# Patient Record
Sex: Female | Born: 1970 | Race: White | Hispanic: No | Marital: Married | Smoking: Former smoker
Health system: Southern US, Community
[De-identification: ages and names within clinical notes are randomized; demographics above are authoritative.]

---

## 2014-11-21 LAB — HM PAP SMEAR: HM PAP: NEGATIVE

## 2016-11-07 ENCOUNTER — Emergency Department (INDEPENDENT_AMBULATORY_CARE_PROVIDER_SITE_OTHER)
Admission: EM | Admit: 2016-11-07 | Discharge: 2016-11-07 | Disposition: A | Discharge status: Home / Self Care | Payer: Managed Care, Other (non HMO) | Source: Home / Self Care | Attending: Family Medicine | Admitting: Family Medicine

## 2016-11-07 ENCOUNTER — Encounter: Payer: Self-pay | Admitting: Emergency Medicine

## 2016-11-07 ENCOUNTER — Emergency Department (INDEPENDENT_AMBULATORY_CARE_PROVIDER_SITE_OTHER): Payer: Managed Care, Other (non HMO)

## 2016-11-07 DIAGNOSIS — R05 Cough: Secondary | ICD-10-CM | POA: Diagnosis not present

## 2016-11-07 DIAGNOSIS — R053 Chronic cough: Secondary | ICD-10-CM

## 2016-11-07 DIAGNOSIS — R0989 Other specified symptoms and signs involving the circulatory and respiratory systems: Secondary | ICD-10-CM

## 2016-11-07 MED ORDER — PREDNISONE 20 MG PO TABS: ORAL_TABLET | ORAL | 0 refills | Status: DC

## 2016-11-07 NOTE — ED Triage Notes (Signed)
Pt has had a wet cough since November, had a bad cold in November, some chest tightness, no ear pain.

## 2016-11-07 NOTE — Discharge Instructions (Signed)
May take Delsym Cough Suppressant at bedtime for nighttime cough.  

## 2016-11-07 NOTE — ED Provider Notes (Signed)
[facility]    CSN: [id number] Arrival date & time: 11/07/16  1357     History   Chief Complaint Chief Complaint  Patient presents with  . Cough    HPI Tracy Burnett is a 46 y.o. female.   Patient reports that she had a cold two months ago and seemed to recover without difficulty.  However she has had a persistent productive cough without pleuritic pain or shortness of breath.  She has had no fevers, chills, and sweats.  She feels well otherwise.   The history is provided by the patient.    History reviewed. No pertinent past medical history.  There are no active problems to display for this patient.   History reviewed. No pertinent surgical history.  OB History    No data available       Home Medications    Prior to Admission medications   Medication Sig Start Date End Date Taking? Authorizing Provider  norethindrone-ethinyl estradiol-iron (ESTROSTEP FE,TILIA FE,TRI-LEGEST FE) 1-20/1-30/1-35 MG-MCG tablet Take 1 tablet by mouth daily.   Yes Historical Provider, MD  predniSONE (DELTASONE) 20 MG tablet Take one tab by mouth twice daily for 5 days, then one daily. Take with food. 11/07/16   Lattie HawStephen A Neesa Knapik, MD    Family History No family history on file.  Social History Social History  Substance Use Topics  . Smoking status: Former Games developermoker  . Smokeless tobacco: Never Used  . Alcohol use Yes     Comment: 3 drinks weekly     Allergies   Patient has no allergy information on record.   Review of Systems Review of Systems No sore throat + cough No pleuritic pain No wheezing No nasal congestion No post-nasal drainage No sinus pain/pressure No itchy/red eyes No earache No hemoptysis No SOB No fever/chills No nausea No vomiting No abdominal pain No diarrhea No urinary symptoms No skin rash No fatigue No myalgias No headache    Physical Exam Triage Vital Signs ED Triage Vitals  Enc Vitals Group     BP 11/07/16 1428 101/72     Pulse Rate 11/07/16 1428 83     Resp --      Temp 11/07/16 1428 98.2 F (36.8 C)     Temp Source 11/07/16 1428 Oral     SpO2 11/07/16 1428 99 %     Weight 11/07/16 1428 132 lb 8 oz (60.1 kg)     Height 11/07/16 1428 5\' 2"  (1.575 m)     Head Circumference --      Peak Flow --      Pain Score 11/07/16 1432 0     Pain Loc --      Pain Edu? --      Excl. in GC? --    No data found.   Updated Vital Signs BP 101/72 (BP Location: Left Arm)   Pulse 83   Temp 98.2 F (36.8 C) (Oral)   Ht 5\' 2"  (1.575 m)   Wt 132 lb 8 oz (60.1 kg)   LMP 10/31/2016 (Exact Date)   SpO2 99%   BMI 24.23 kg/m   Visual Acuity Right Eye Distance:   Left Eye Distance:   Bilateral Distance:    Right Eye Near:   Left Eye Near:    Bilateral Near:     Physical Exam Nursing notes and Vital Signs reviewed. Appearance:  Patient appears stated age, and in no acute distress Eyes:  Pupils are equal, round, and reactive to light and  accomodation.  Extraocular movement is intact.  Conjunctivae are not inflamed  Ears:  Canals normal.  Tympanic membranes normal.  Nose:   Normal turbinates.  No sinus tenderness.     Pharynx:  Normal Neck:  Supple.  No adenopathy. Lungs:  Clear to auscultation.  Breath sounds are equal.  Moving air well. Heart:  Regular rate and rhythm without murmurs, rubs, or gallops.  Abdomen:  Nontender without masses or hepatosplenomegaly.  Bowel sounds are present.  No CVA or flank tenderness.  Extremities:  No edema.  Skin:  No rash present.    UC Treatments / Results  Labs (all labs ordered are listed, but only abnormal results are displayed) Labs Reviewed - No data to display  EKG  EKG Interpretation None       Radiology Dg Chest 2 View  Result Date: 11/07/2016 CLINICAL DATA:  Persistent cough, chest tightness EXAM: CHEST  2 VIEW COMPARISON:  None. FINDINGS: Cardiomediastinal silhouette is unremarkable. No infiltrate or pleural effusion. No pulmonary edema. Mild upper  thoracic levoscoliosis. Minimal degenerative changes mid thoracic spine. IMPRESSION: No active cardiopulmonary disease. Electronically Signed   By: Natasha Mead M.D.   On: 11/07/2016 15:06    Procedures Procedures (including critical care time)  Medications Ordered in UC Medications - No data to display   Initial Impression / Assessment and Plan / UC Course  I have reviewed the triage vital signs and the nursing notes.  Pertinent labs & imaging results that were available during my care of the patient were reviewed by me and considered in my medical decision making (see chart for details).    There is no evidence of bacterial infection today.   Suspect post-infectious cough. Begin prednisone burst/taper. May take Delsym Cough Suppressant at bedtime for nighttime cough.  Followup with Family Doctor if not improved in one week.     Final Clinical Impressions(s) / UC Diagnoses   Final diagnoses:  Persistent cough for 3 weeks or longer    New Prescriptions New Prescriptions   PREDNISONE (DELTASONE) 20 MG TABLET    Take one tab by mouth twice daily for 5 days, then one daily. Take with food.     Lattie Haw, MD 11/14/16 [phone]

## 2017-01-28 ENCOUNTER — Ambulatory Visit (INDEPENDENT_AMBULATORY_CARE_PROVIDER_SITE_OTHER): Payer: Managed Care, Other (non HMO) | Admitting: Osteopathic Medicine

## 2017-01-28 ENCOUNTER — Encounter: Payer: Self-pay | Admitting: Osteopathic Medicine

## 2017-01-28 VITALS — BP 98/52 | HR 75 | Ht 62.0 in | Wt 129.0 lb

## 2017-01-28 DIAGNOSIS — Z862 Personal history of diseases of the blood and blood-forming organs and certain disorders involving the immune mechanism: Secondary | ICD-10-CM | POA: Diagnosis not present

## 2017-01-28 DIAGNOSIS — Z8669 Personal history of other diseases of the nervous system and sense organs: Secondary | ICD-10-CM | POA: Diagnosis not present

## 2017-01-28 DIAGNOSIS — Z0000 Encounter for general adult medical examination without abnormal findings: Secondary | ICD-10-CM | POA: Diagnosis not present

## 2017-01-28 DIAGNOSIS — F331 Major depressive disorder, recurrent, moderate: Secondary | ICD-10-CM | POA: Diagnosis not present

## 2017-01-28 MED ORDER — SUMATRIPTAN SUCCINATE 100 MG PO TABS: 100.0000 mg | ORAL_TABLET | ORAL | 2 refills | Status: DC | PRN

## 2017-01-28 MED ORDER — FLUOXETINE HCL 10 MG PO TABS: 10.0000 mg | ORAL_TABLET | Freq: Every day | ORAL | 1 refills | Status: DC

## 2017-01-28 NOTE — Patient Instructions (Signed)
Plan: 1. Will start Prozac, plan to followup in 4-6 weeks or sooner if needed for depression further discussion 2. Fasting labs at your convenience

## 2017-01-28 NOTE — Progress Notes (Signed)
HPI: Tracy Burnett is a 46 y.o. female  who presents to [facility] today, 01/28/17,  for chief complaint of:  Chief Complaint  Patient presents with  . Annual Exam  . Depression    Patient requests annual physical exam for discount from employer. Has other specific problem-based complaints as well. See below for review of preventive care. Other medical issues addressed as follows:  Depression: Patient reports increased stress at home, she has a stressful job that well. Has 2 daughters, both of whom have ADHD and the older has behavioral problems in addition to that. She feels overwhelmed managing the household as well as dealing with work. No history of other depressive problems in the past, is not interested in seeking counseling at this point due to unable to get off work without taking unpaid leave.  Sleep issues: Has been taking Benadryl on a nightly basis which is helping but she is nervous to be taking this medication long-term. Previously taking Tylenol PM but was nervous to be taking acetaminophen too frequently.      Past medical, surgical, social and family history reviewed: There are no active problems to display for this patient.  No past surgical history on file. Social History  Substance Use Topics  . Smoking status: Former Games developer  . Smokeless tobacco: Never Used  . Alcohol use Yes     Comment: 3 drinks weekly   No family history on file.   Current medication list and allergy/intolerance information reviewed:   Current Outpatient Prescriptions  Medication Sig Dispense Refill  . norethindrone-ethinyl estradiol-iron (ESTROSTEP FE,TILIA FE,TRI-LEGEST FE) 1-20/1-30/1-35 MG-MCG tablet Take 1 tablet by mouth daily.     No current facility-administered medications for this visit.    Not on File    Review of Systems:  Constitutional:  No  fever, no chills, No recent illness, No unintentional weight changes. +significant fatigue.    HEENT: No  headache, no vision change, no hearing change, No sore throat, No  sinus pressure  Cardiac: No  chest pain, No  pressure, No palpitations, No  Orthopnea  Respiratory:  No  shortness of breath. No  Cough  Gastrointestinal: No  abdominal pain, No  nausea, No  vomiting,  No  blood in stool, No  diarrhea, No  constipation   Musculoskeletal: No new myalgia/arthralgia  Genitourinary: No  incontinence, No  abnormal genital bleeding, No abnormal genital discharge  Skin: No  Rash, No other wounds/concerning lesions  Endocrine: No cold intolerance,  No heat intolerance. No polyuria/polydipsia/polyphagia   Neurologic: No  weakness, No  dizziness, No  slurred speech/focal weakness/facial droop, +memory problems   Psychiatric: +concerns with depression, No  concerns with anxiety, +sleep problems, No mood problems  Exam:  BP (!) 98/52   Pulse 75   Ht  (1.575 m)   Wt 129 lb (58.5 kg)   BMI 23.59 kg/m   Constitutional: VS see above. General Appearance: alert, well-developed, well-nourished, NAD  Eyes: Normal lids and conjunctive, non-icteric sclera  Ears, Nose, Mouth, Throat: MMM, Normal external inspection ears/nares/mouth/lips/gums. TM normal bilaterally. Pharynx/tonsils no erythema, no exudate. Nasal mucosa normal.   Neck: No masses, trachea midline. No thyroid enlargement. No tenderness/mass appreciated. No lymphadenopathy  Respiratory: Normal respiratory effort. no wheeze, no rhonchi, no rales  Cardiovascular: S1/S2 normal, no murmur, no rub/gallop auscultated. RRR. No lower extremity edema.   Gastrointestinal: Nontender, no masses. No hepatomegaly, no splenomegaly. No hernia appreciated. Bowel sounds normal. Rectal exam deferred.   Musculoskeletal:  Gait normal. No clubbing/cyanosis of digits.   Neurological: Normal balance/coordination. No tremor. No cranial nerve deficit on limited exam. Motor and sensation intact and symmetric. Cerebellar reflexes intact.    Skin: warm, dry, intact. No rash/ulcer. No concerning nevi or subq nodules on limited exam.    Psychiatric: Normal judgment/insight. Normal mood and affect. Oriented x3. (+)passive death wish but no thoughts of hurting self/others    Depression screen [facility] 2/9 01/28/2017  Decreased Interest 2  Down, Depressed, Hopeless 3  PHQ - 2 Score 5  Altered sleeping 3  Tired, decreased energy 3  Change in appetite 2  Feeling bad or failure about yourself  3  Trouble concentrating 1  Moving slowly or fidgety/restless 0  Suicidal thoughts 2  PHQ-9 Score 19     ASSESSMENT/PLAN:   Preventive care: See below. Patient is up-to-date except for routine screening labs. We'll also get iron panel given history of anemia.  Problem-based: Requests treatment for depression. Will trial low-dose fluoxetine with plans to titrate up if tolerating medication but not feeling full effect. Again, patient declines counseling at this time. Advised follow-up in 4-6 weeks for recheck. We'll try to work with the patient, understandable that it is difficult to get time off work but unless her symptoms totally resolve on the medication would definitely want to follow-up with her.  Annual physical exam - Plan: CBC with Differential/Platelet, Lipid panel, TSH, COMPLETE METABOLIC PANEL WITH GFR, VITAMIN D 25 Hydroxy (Vit-D Deficiency, Fractures), Iron, TIBC and Ferritin Panel  Moderate episode of recurrent major depressive disorder (HCC) - Plan: FLUoxetine (PROZAC) 10 MG tablet  History of anemia - Plan: CBC with Differential/Platelet, Iron, TIBC and Ferritin Panel  History of migraine headaches - Plan: SUMAtriptan (IMITREX) 100 MG tablet   FEMALE PREVENTIVE CARE Updated 01/28/17   ANNUAL SCREENING/COUNSELING  Diet/Exercise - HEALTHY HABITS DISCUSSED TO DECREASE CV RISK History  Smoking Status  . Former Smoker  Smokeless Tobacco  . Never Used   History  Alcohol Use  . Yes    Comment: 3 drinks weekly    Depression screen [facility] 2/9 01/28/2017  Decreased Interest 2  Down, Depressed, Hopeless 3  PHQ - 2 Score 5  Altered sleeping 3  Tired, decreased energy 3  Change in appetite 2  Feeling bad or failure about yourself  3  Trouble concentrating 1  Moving slowly or fidgety/restless 0  Suicidal thoughts 2  PHQ-9 Score 19    Domestic violence concerns - no  HTN SCREENING - SEE VITALS  SEXUAL HEALTH  Sexually active in the past year - Yes with female.  Need/want STI testing today? - no  Concerns about libido or pain with sex? - no  Plans for pregnancy? - on OCP   INFECTIOUS DISEASE SCREENING  HIV - does not need  GC/CT - does not need  HepC - DOB [birth year] - does not need  TB - does not need  DISEASE SCREENING  Lipid - needs  DM2 - needs  Osteoporosis - women age 60+ - does not need  CANCER SCREENING  Cervical - does not need  Breast - does not need  Lung - does not need  Colon - does not need  ADULT VACCINATION  Influenza - annual vaccine recommended  Td - booster every 10 years   Zoster - option at 50, yes at 60+     There is no immunization history on file for this patient.     Patient Instructions  Plan: 1. Will start  Prozac, plan to followup in 4-6 weeks or sooner if needed for depression further discussion 2. Fasting labs at your convenience       Visit summary with medication list and pertinent instructions was printed for patient to review. All questions at time of visit were answered - patient instructed to contact office with any additional concerns. ER/RTC precautions were reviewed with the patient. Follow-up plan: Return in about 1 year (around 01/28/2018) for ANNUAL PHYSICAL, soonre to address any other issues and as directed for depression follow-up .    There are other unrelated non-urgent complaints (memory concerns, insomnia, musculoskeletal complaints), but due to the busy schedule and the amount of time I've already spent  with her, time does not permit me to address these routine issues at today's visit. I've requested another appointment to review these additional issues.

## 2017-03-05 LAB — COMPLETE METABOLIC PANEL WITH GFR
ALBUMIN: 4 g/dL (ref 3.6–5.1)
ALK PHOS: 33 U/L (ref 33–115)
ALT: 16 U/L (ref 6–29)
AST: 15 U/L (ref 10–35)
BUN: 16 mg/dL (ref 7–25)
CALCIUM: 8.7 mg/dL (ref 8.6–10.2)
CO2: 25 mmol/L (ref 20–31)
CREATININE: 0.93 mg/dL (ref 0.50–1.10)
Chloride: 107 mmol/L (ref 98–110)
GFR, EST AFRICAN AMERICAN: 85 mL/min (ref >=60)
GFR, Est Non African American: 74 mL/min (ref >=60)
Glucose, Bld: 89 mg/dL (ref 65–99)
POTASSIUM: 4.5 mmol/L (ref 3.5–5.3)
Sodium: 140 mmol/L (ref 135–146)
TOTAL PROTEIN: 6.1 g/dL (ref 6.1–8.1)
Total Bilirubin: 0.5 mg/dL (ref 0.2–1.2)

## 2017-03-05 LAB — CBC WITH DIFFERENTIAL/PLATELET
BASOS ABS: 63 {cells}/uL (ref 0–200)
Basophils Relative: 1 %
EOS ABS: 189 {cells}/uL (ref 15–500)
EOS PCT: 3 %
HCT: 44.5 % (ref 35.0–45.0)
HEMOGLOBIN: 14.7 g/dL (ref 11.7–15.5)
Lymphocytes Relative: 34 %
Lymphs Abs: 2142 cells/uL (ref 850–3900)
MCH: 29.8 pg (ref 27.0–33.0)
MCHC: 33 g/dL (ref 32.0–36.0)
MCV: 90.3 fL (ref 80.0–100.0)
MONOS PCT: 5 %
MPV: 11.1 fL (ref 7.5–12.5)
Monocytes Absolute: 315 cells/uL (ref 200–950)
NEUTROS ABS: 3591 {cells}/uL (ref 1500–7800)
NEUTROS PCT: 57 %
PLATELETS: 207 10*3/uL (ref 140–400)
RBC: 4.93 MIL/uL (ref 3.80–5.10)
RDW: 13.5 % (ref 11.0–15.0)
WBC: 6.3 10*3/uL (ref 3.8–10.8)

## 2017-03-05 LAB — LIPID PANEL
CHOLESTEROL: 171 mg/dL (ref <200)
HDL: 53 mg/dL (ref >50)
LDL Cholesterol: 110 mg/dL — ABNORMAL HIGH (ref <100)
Total CHOL/HDL Ratio: 3.2 Ratio (ref <5.0)
Triglycerides: 38 mg/dL (ref <150)
VLDL: 8 mg/dL (ref <30)

## 2017-03-06 LAB — IRON,TIBC AND FERRITIN PANEL
%SAT: 14 % (ref 11–50)
FERRITIN: 53 ng/mL (ref 10–232)
Iron: 43 ug/dL (ref 40–190)
TIBC: 302 ug/dL (ref 250–450)

## 2017-03-06 LAB — VITAMIN D 25 HYDROXY (VIT D DEFICIENCY, FRACTURES): VIT D 25 HYDROXY: 40 ng/mL (ref 30–100)

## 2017-03-06 LAB — TSH: TSH: 3.44 mIU/L

## 2017-03-16 ENCOUNTER — Other Ambulatory Visit: Payer: Self-pay | Admitting: Osteopathic Medicine

## 2017-03-16 DIAGNOSIS — F331 Major depressive disorder, recurrent, moderate: Secondary | ICD-10-CM

## 2017-04-07 ENCOUNTER — Other Ambulatory Visit: Payer: Self-pay | Admitting: Osteopathic Medicine

## 2017-04-07 DIAGNOSIS — F331 Major depressive disorder, recurrent, moderate: Secondary | ICD-10-CM

## 2017-04-15 ENCOUNTER — Other Ambulatory Visit: Payer: Self-pay | Admitting: Osteopathic Medicine

## 2017-04-15 DIAGNOSIS — F331 Major depressive disorder, recurrent, moderate: Secondary | ICD-10-CM

## 2017-05-14 ENCOUNTER — Other Ambulatory Visit: Payer: Self-pay | Admitting: Osteopathic Medicine

## 2017-05-14 DIAGNOSIS — F331 Major depressive disorder, recurrent, moderate: Secondary | ICD-10-CM

## 2017-05-31 ENCOUNTER — Other Ambulatory Visit: Payer: Self-pay | Admitting: Osteopathic Medicine

## 2017-05-31 DIAGNOSIS — F331 Major depressive disorder, recurrent, moderate: Secondary | ICD-10-CM

## 2017-06-18 ENCOUNTER — Other Ambulatory Visit: Payer: Self-pay | Admitting: Osteopathic Medicine

## 2017-06-19 ENCOUNTER — Other Ambulatory Visit: Payer: Self-pay | Admitting: Osteopathic Medicine

## 2017-06-19 DIAGNOSIS — F331 Major depressive disorder, recurrent, moderate: Secondary | ICD-10-CM

## 2017-06-22 ENCOUNTER — Telehealth: Payer: Self-pay | Admitting: Osteopathic Medicine

## 2017-06-22 NOTE — Telephone Encounter (Signed)
I called pt on her cell and left a message for her to schedule a f/u appt with Dr.Alexander

## 2017-06-23 ENCOUNTER — Other Ambulatory Visit: Payer: Self-pay | Admitting: Osteopathic Medicine

## 2017-06-23 DIAGNOSIS — F331 Major depressive disorder, recurrent, moderate: Secondary | ICD-10-CM

## 2017-06-30 ENCOUNTER — Other Ambulatory Visit: Payer: Self-pay | Admitting: Osteopathic Medicine

## 2017-06-30 DIAGNOSIS — F331 Major depressive disorder, recurrent, moderate: Secondary | ICD-10-CM

## 2017-07-21 ENCOUNTER — Other Ambulatory Visit: Payer: Self-pay | Admitting: Osteopathic Medicine

## 2017-07-23 ENCOUNTER — Other Ambulatory Visit: Payer: Self-pay | Admitting: Osteopathic Medicine

## 2017-08-02 ENCOUNTER — Other Ambulatory Visit: Payer: Self-pay | Admitting: Osteopathic Medicine

## 2017-08-30 ENCOUNTER — Other Ambulatory Visit: Payer: Self-pay | Admitting: Osteopathic Medicine

## 2017-09-01 ENCOUNTER — Telehealth: Payer: Self-pay

## 2017-09-01 MED ORDER — FLUOXETINE HCL 10 MG PO TABS: ORAL_TABLET | ORAL | 3 refills | Status: DC

## 2017-09-01 NOTE — Telephone Encounter (Signed)
Fine to send #90 w/ 3 refills to whatever pharmacy she'd like

## 2017-09-01 NOTE — Telephone Encounter (Signed)
Patient called and requested a refill on Prozac, advised patient that she needed a follow up appointment and she stated that she does not have insurance,please advise. Torence Palmeri,CMA

## 2017-09-02 NOTE — Telephone Encounter (Signed)
Done. Endia Moncur,CMA  

## 2017-10-02 ENCOUNTER — Other Ambulatory Visit: Payer: Self-pay | Admitting: Osteopathic Medicine

## 2017-10-02 DIAGNOSIS — F331 Major depressive disorder, recurrent, moderate: Secondary | ICD-10-CM

## 2017-10-23 ENCOUNTER — Other Ambulatory Visit: Payer: Self-pay | Admitting: Osteopathic Medicine

## 2017-10-23 DIAGNOSIS — F331 Major depressive disorder, recurrent, moderate: Secondary | ICD-10-CM

## 2017-11-06 ENCOUNTER — Other Ambulatory Visit: Payer: Self-pay | Admitting: Osteopathic Medicine

## 2017-11-06 DIAGNOSIS — Z8669 Personal history of other diseases of the nervous system and sense organs: Secondary | ICD-10-CM

## 2017-11-08 NOTE — Telephone Encounter (Signed)
Pharmacy requesting refill. Pls advise if appropriate. Thanks. 

## 2017-12-28 ENCOUNTER — Telehealth: Payer: Self-pay

## 2017-12-28 DIAGNOSIS — F329 Major depressive disorder, single episode, unspecified: Secondary | ICD-10-CM

## 2017-12-28 DIAGNOSIS — F419 Anxiety disorder, unspecified: Principal | ICD-10-CM

## 2017-12-28 NOTE — Telephone Encounter (Signed)
Called pt, no answer. Left a brief vm msg for pt to return a call back regarding provider's note.

## 2017-12-28 NOTE — Telephone Encounter (Signed)
Pt called requesting a referral for Mental health for depression/medication maintenance. As per pt, still having episodes & wants to do therapy. Pt can be contacted by provider at [phone] for further inquiries.

## 2017-12-28 NOTE — Telephone Encounter (Signed)
This person has had one visit with me almost a year ago. I'm happy to place a referral if she would like, she can also come here to talk to me if needed. We can always adjust medications from our end first especially if it takes a while to get into see a psychiatrist and counselor.   Please follow up with her regarding the above it and if there is anywhere in particular she would like the referral sent or if she needs appointment with me. Will go ahead and place referral downstairs

## 2018-01-05 NOTE — Telephone Encounter (Signed)
[facility] today & [facility]

## 2018-01-09 ENCOUNTER — Other Ambulatory Visit: Payer: Self-pay | Admitting: Osteopathic Medicine

## 2018-01-09 DIAGNOSIS — Z8669 Personal history of other diseases of the nervous system and sense organs: Secondary | ICD-10-CM

## 2018-03-19 ENCOUNTER — Other Ambulatory Visit: Payer: Self-pay | Admitting: Osteopathic Medicine

## 2018-03-19 DIAGNOSIS — Z8669 Personal history of other diseases of the nervous system and sense organs: Secondary | ICD-10-CM

## 2018-06-29 ENCOUNTER — Encounter: Payer: Self-pay | Admitting: Osteopathic Medicine

## 2018-06-29 ENCOUNTER — Ambulatory Visit (INDEPENDENT_AMBULATORY_CARE_PROVIDER_SITE_OTHER): Payer: 59 | Admitting: Osteopathic Medicine

## 2018-06-29 VITALS — BP 103/59 | HR 90 | Temp 98.8°F | Wt 138.2 lb

## 2018-06-29 DIAGNOSIS — Z23 Encounter for immunization: Secondary | ICD-10-CM

## 2018-06-29 DIAGNOSIS — Z8669 Personal history of other diseases of the nervous system and sense organs: Secondary | ICD-10-CM | POA: Diagnosis not present

## 2018-06-29 DIAGNOSIS — Z862 Personal history of diseases of the blood and blood-forming organs and certain disorders involving the immune mechanism: Secondary | ICD-10-CM

## 2018-06-29 DIAGNOSIS — I959 Hypotension, unspecified: Secondary | ICD-10-CM | POA: Diagnosis not present

## 2018-06-29 DIAGNOSIS — Z0000 Encounter for general adult medical examination without abnormal findings: Secondary | ICD-10-CM | POA: Diagnosis not present

## 2018-06-29 DIAGNOSIS — E559 Vitamin D deficiency, unspecified: Secondary | ICD-10-CM

## 2018-06-29 MED ORDER — SUMATRIPTAN SUCCINATE 100 MG PO TABS: 50.0000 mg | ORAL_TABLET | Freq: Once | ORAL | 0 refills | Status: DC

## 2018-06-29 MED ORDER — FLUOXETINE HCL 10 MG PO TABS: ORAL_TABLET | ORAL | 3 refills | Status: DC

## 2018-06-29 NOTE — Progress Notes (Signed)
HPI: Tracy Burnett is a 47 y.o. female who  has no past medical history on file.  she presents to [facility] today, 06/29/18,  for chief complaint of: Annual physical   Patient here for annual physical / wellness exam.  See preventive care reviewed as below.  Last year's labs reviewed in detail with the patient.   Additional concerns today include:  Low blood pressure - was unable to donate blood because of this not too long ago   BP Readings from Last 3 Encounters:  06/29/18 (!) 103/59  01/28/17 (!) 98/52  11/07/16 101/72   78/52, 82/58 at blood bank - ???  No dizziness, lightheadedness, weakness.  Patient states the blood bank used an automatic cuff, did not recheck manually.   Past medical, surgical, social and family history reviewed:  Patient Active Problem List   Diagnosis Date Noted  . Moderate episode of recurrent major depressive disorder (HCC) 01/28/2017  . History of anemia 01/28/2017  . History of migraine headaches 01/28/2017    Past Surgical History:  Procedure Laterality Date  . CESAREAN SECTION  11/08/2005    Social History   Tobacco Use  . Smoking status: Former Smoker    Last attempt to quit: 07/30/2000    Years since quitting: 17.9  . Smokeless tobacco: Never Used  Substance Use Topics  . Alcohol use: Yes    Comment: 5    Family History  Problem Relation Age of Onset  . Alcoholism Father   . Alcoholism Maternal Grandfather   . Leukemia Paternal Grandmother   . Alcoholism Paternal Grandfather      Current medication list and allergy/intolerance information reviewed:    Current Outpatient Medications  Medication Sig Dispense Refill  . CALCIUM PO Take by mouth.    Marland Kitchen FLUoxetine (PROZAC) 10 MG tablet TAKE 1 TABLET BY MOUTH EVERY DAY --NEEDS APPOINTMENT 90 tablet 3  . IRON PO Take 65 mg by mouth.    . norethindrone-ethinyl estradiol-iron (ESTROSTEP FE,TILIA FE,TRI-LEGEST FE) 1-20/1-30/1-35 MG-MCG  tablet Take 1 tablet by mouth daily.    . SUMAtriptan (IMITREX) 100 MG tablet TAKE 1 TAB BY MOUTH EVERY 2 HOURS AS NEEDED FOR MIGRAINE. MAY REPEAT IN 2 HOURS IF MIGRAINE PERSISTS 10 tablet 0   No current facility-administered medications for this visit.     Not on File    Review of Systems:  Constitutional:  No  fever, no chills, No recent illness, No unintentional weight changes. No significant fatigue.   HEENT: No  headache, no vision change, no hearing change, No sore throat, No  sinus pressure  Cardiac: No  chest pain, No  pressure, No palpitations, No  Orthopnea  Respiratory:  No  shortness of breath. No  Cough  Gastrointestinal: No  abdominal pain, No  nausea, No  vomiting,  No  blood in stool, No  diarrhea, No  constipation   Musculoskeletal: No new myalgia/arthralgia  Skin: No  Rash, No other wounds/concerning lesions  Genitourinary: No  incontinence, No  abnormal genital bleeding, No abnormal genital discharge  Hem/Onc: No  easy bruising/bleeding, No  abnormal lymph node  Endocrine: No cold intolerance,  No heat intolerance. No polyuria/polydipsia/polyphagia   Neurologic: No  weakness, No  dizziness, No  slurred speech/focal weakness/facial droop  Psychiatric: No  concerns with depression, No  concerns with anxiety, No sleep problems, No mood problems  Exam:  BP (!) 103/59 (BP Location: Left Arm, Patient Position: Sitting, Cuff Size: Normal)   Pulse 90  Temp 98.8 F (37.1 C) (Oral)   Wt 138 lb 3.2 oz (62.7 kg)   BMI 25.28 kg/m   Constitutional: VS see above. General Appearance: alert, well-developed, well-nourished, NAD  Eyes: Normal lids and conjunctive, non-icteric sclera  Ears, Nose, Mouth, Throat: MMM, Normal external inspection ears/nares/mouth/lips/gums. TM normal bilaterally. Pharynx/tonsils no erythema, no exudate. Nasal mucosa normal.   Neck: No masses, trachea midline. No thyroid enlargement. No tenderness/mass appreciated. No  lymphadenopathy  Respiratory: Normal respiratory effort. no wheeze, no rhonchi, no rales  Cardiovascular: S1/S2 normal, no murmur, no rub/gallop auscultated. RRR. No lower extremity edema. Pedal pulse II/IV bilaterally DP and PT. No carotid bruit or JVD. No abdominal aortic bruit.  Gastrointestinal: Nontender, no masses. No hepatomegaly, no splenomegaly. No hernia appreciated. Bowel sounds normal. Rectal exam deferred.   Musculoskeletal: Gait normal. No clubbing/cyanosis of digits.   Neurological: Normal balance/coordination. No tremor. No cranial nerve deficit on limited exam. Motor and sensation intact and symmetric. Cerebellar reflexes intact.   Skin: warm, dry, intact. No rash/ulcer. No concerning nevi or subq nodules on limited exam.    Psychiatric: Normal judgment/insight. Normal mood and affect. Oriented x3.      ASSESSMENT/PLAN: The primary encounter diagnosis was Annual physical exam. Diagnoses of History of anemia, History of migraine headaches, Hypotension, unspecified hypotension type, Vitamin D deficiency, Need for Tdap vaccination, and Need for influenza vaccination were also pertinent to this visit.  Orders Placed This Encounter  Procedures  . MM 3D SCREEN BREAST BILATERAL  . Tdap vaccine greater than or equal to 7yo IM  . Flu Vaccine QUAD 6+ mos PF IM (Fluarix Quad PF)  . CBC  . COMPLETE METABOLIC PANEL WITH GFR  . Lipid panel  . TSH  . VITAMIN D 25 Hydroxy (Vit-D Deficiency, Fractures)  . Fe+TIBC+Fer      Meds ordered this encounter  Medications  . FLUoxetine (PROZAC) 10 MG tablet    Sig: TAKE 1 TABLET BY MOUTH EVERY DAY --NEEDS APPOINTMENT    Dispense:  90 tablet    Refill:  3  . SUMAtriptan (IMITREX) 100 MG tablet    Sig: Take 0.5-1 tablets (50-100 mg total) by mouth once for 1 dose. May repeat in 2 hours if headache persists or recurs.    Dispense:  10 tablet    Refill:  0     Patient Instructions  General Preventive Care  Most recent routine  screening lipids/other labs: ordered today. Please get blood work done fasting (no food 8 hours, water or black coffee is ok, can take all usual medicines)   Tobacco: don't! Alcohol: responsible moderation is ok for most adults, no more than one drink per day for women. Recreational/Illicit Drugs: don't!  Exercise: as tolerated to reduce risk of cardiovascular disease and diabetes  Mental health: if need for mental health care (medicines, counseling, other), or concerns about moods, please let me know!   Sexual health: if ever need for STD testing, please let me know!  Vaccines  Flu vaccine: recommended every fall (by Halloween!)  Shingles vaccine: Shingrix recommended after age 33  Pneumonia vaccines: Prevnar and Pneumovax recommended after age 47, sooner if diabetes, COPD/asthma, others  Tetanus booster: Tdap recommended every 10 years Cancer screenings   Colon cancer screening: recommended at age 96, colonoscopy sooner if risk factors   Breast cancer screening: mammograms recommended starting at age 37  Cervical cancer screening: Pap screening every 1 to 5 years depending on age and other risk factors. Your last Pap 11/2014 was  normal, you'll be due to repeat in 11/2019. Can stop at age 66 or w/ hysterectomy as long as previous testing was normal.  Infection screenings . HIV: recommended screening at least once age 46-65, more often if risk factors  . Gonorrhea/Chlamydia: screening as needed . Hepatitis C: recommended for anyone born 1945-1965, in your case we don't need to worry unless there is concern for exposure  Other . Bone Density Test: recommended for women at age 47, sooner depending on risk factors . Advanced Directive: Living Will and/or Healthcare Power of Attorney recommended for all adults, regardless of age or health . Cholesterol & Diabetes: recommended screening annually . Thyroid and Vitamin D: routine screening not medically necessary for most people, most  insurance plans will not cover this testing as part of annual physical. If you desire this testing but have concerns about coverage, we can order the test and you can check with the lab how much the test will cost, and the lab can cancel the test if you decide you don't want it.    Immunization History  Administered Date(s) Administered  . Influenza Split 07/19/2014  . Influenza, Quadrivalent, Recombinant, Inj, Pf 08/12/2016  . Influenza,inj,Quad PF,6+ Mos 06/29/2018  . Tdap 06/29/2018     Visit summary with medication list and pertinent instructions was printed for patient to review. All questions at time of visit were answered - patient instructed to contact office with any additional concerns. ER/RTC precautions were reviewed with the patient.   Follow-up plan: Return in about 1 year (around 06/30/2019) for annual check-up, sooner if needed.    Please note: voice recognition software was used to produce this document, and typos may escape review. Please contact Dr. Lyn Hollingshead for any needed clarifications.

## 2018-06-29 NOTE — Patient Instructions (Addendum)
General Preventive Care  Most recent routine screening lipids/other labs: ordered today. Please get blood work done fasting (no food 8 hours, water or black coffee is ok, can take all usual medicines)   Tobacco: don't! Alcohol: responsible moderation is ok for most adults, no more than one drink per day for women. Recreational/Illicit Drugs: don't!  Exercise: as tolerated to reduce risk of cardiovascular disease and diabetes  Mental health: if need for mental health care (medicines, counseling, other), or concerns about moods, please let me know!   Sexual health: if ever need for STD testing, please let me know!  Vaccines  Flu vaccine: recommended every fall (by Halloween!)  Shingles vaccine: Shingrix recommended after age 42  Pneumonia vaccines: Prevnar and Pneumovax recommended after age 31, sooner if diabetes, COPD/asthma, others  Tetanus booster: Tdap recommended every 10 years Cancer screenings   Colon cancer screening: recommended at age 72, colonoscopy sooner if risk factors   Breast cancer screening: mammograms recommended starting at age 6  Cervical cancer screening: Pap screening every 1 to 5 years depending on age and other risk factors. Your last Pap 11/2014 was normal, you'll be due to repeat in 11/2019. Can stop at age 25 or w/ hysterectomy as long as previous testing was normal.  Infection screenings . HIV: recommended screening at least once age 70-65, more often if risk factors  . Gonorrhea/Chlamydia: screening as needed . Hepatitis C: recommended for anyone born 1945-1965, in your case we don't need to worry unless there is concern for exposure  Other . Bone Density Test: recommended for women at age 71, sooner depending on risk factors . Advanced Directive: Living Will and/or Healthcare Power of Attorney recommended for all adults, regardless of age or health . Cholesterol & Diabetes: recommended screening annually . Thyroid and Vitamin D: routine screening not  medically necessary for most people, most insurance plans will not cover this testing as part of annual physical. If you desire this testing but have concerns about coverage, we can order the test and you can check with the lab how much the test will cost, and the lab can cancel the test if you decide you don't want it.

## 2018-07-05 LAB — COMPLETE METABOLIC PANEL WITH GFR
AG RATIO: 2.1 (calc) (ref 1.0–2.5)
ALKALINE PHOSPHATASE (APISO): 34 U/L (ref 33–115)
ALT: 9 U/L (ref 6–29)
AST: 12 U/L (ref 10–35)
Albumin: 4.2 g/dL (ref 3.6–5.1)
BILIRUBIN TOTAL: 0.7 mg/dL (ref 0.2–1.2)
BUN: 13 mg/dL (ref 7–25)
CHLORIDE: 106 mmol/L (ref 98–110)
CO2: 26 mmol/L (ref 20–32)
CREATININE: 0.73 mg/dL (ref 0.50–1.10)
Calcium: 9 mg/dL (ref 8.6–10.2)
GFR, Est African American: 113 mL/min/{1.73_m2} (ref >=60)
GFR, Est Non African American: 97 mL/min/{1.73_m2} (ref >=60)
Globulin: 2 g/dL (calc) (ref 1.9–3.7)
Glucose, Bld: 78 mg/dL (ref 65–99)
POTASSIUM: 4.8 mmol/L (ref 3.5–5.3)
SODIUM: 139 mmol/L (ref 135–146)
Total Protein: 6.2 g/dL (ref 6.1–8.1)

## 2018-07-05 LAB — CBC
HEMATOCRIT: 41.7 % (ref 35.0–45.0)
HEMOGLOBIN: 13.9 g/dL (ref 11.7–15.5)
MCH: 29.7 pg (ref 27.0–33.0)
MCHC: 33.3 g/dL (ref 32.0–36.0)
MCV: 89.1 fL (ref 80.0–100.0)
MPV: 11 fL (ref 7.5–12.5)
Platelets: 184 10*3/uL (ref 140–400)
RBC: 4.68 10*6/uL (ref 3.80–5.10)
RDW: 12 % (ref 11.0–15.0)
WBC: 5.9 10*3/uL (ref 3.8–10.8)

## 2018-07-05 LAB — IRON,TIBC AND FERRITIN PANEL
%SAT: 32 % (calc) (ref 16–45)
FERRITIN: 96 ng/mL (ref 16–232)
IRON: 90 ug/dL (ref 40–190)
TIBC: 283 mcg/dL (calc) (ref 250–450)

## 2018-07-05 LAB — LIPID PANEL
CHOL/HDL RATIO: 3.1 (calc) (ref <5.0)
Cholesterol: 184 mg/dL (ref <200)
HDL: 60 mg/dL (ref >50)
LDL Cholesterol (Calc): 110 mg/dL (calc) — ABNORMAL HIGH
Non-HDL Cholesterol (Calc): 124 mg/dL (calc) (ref <130)
Triglycerides: 54 mg/dL (ref <150)

## 2018-07-05 LAB — TSH: TSH: 2.84 mIU/L

## 2018-07-05 LAB — VITAMIN D 25 HYDROXY (VIT D DEFICIENCY, FRACTURES): VIT D 25 HYDROXY: 34 ng/mL (ref 30–100)

## 2018-07-07 ENCOUNTER — Encounter: Payer: Self-pay | Admitting: Osteopathic Medicine

## 2018-07-21 ENCOUNTER — Ambulatory Visit (INDEPENDENT_AMBULATORY_CARE_PROVIDER_SITE_OTHER): Payer: 59

## 2018-07-21 DIAGNOSIS — Z1231 Encounter for screening mammogram for malignant neoplasm of breast: Secondary | ICD-10-CM

## 2018-09-14 ENCOUNTER — Telehealth: Payer: Self-pay

## 2018-09-14 NOTE — Telephone Encounter (Signed)
Pt called stating she received a bill for labs order on 07/04/18. As per pt, she called her insurance and was informed that labs were coded as "general illness". Pt stated she was not sick, appt was a preventative appt. Pt is requesting that we update lab codes to reflect preventative care visit. Pls advise, thanks.

## 2018-09-15 NOTE — Telephone Encounter (Signed)
We did annual but pt also had concerns about low blood pressure which is considered "problem based" and insurance may have not covered all labs because of this. Will route to Toniann FailWendy and Molli HazardMatthew for review.

## 2019-02-03 ENCOUNTER — Other Ambulatory Visit: Payer: Self-pay | Admitting: Osteopathic Medicine

## 2019-02-03 NOTE — Telephone Encounter (Signed)
Routing to Dr Alexander's rx refill pool 

## 2019-03-30 IMAGING — MG DIGITAL SCREENING BILATERAL MAMMOGRAM WITH TOMO AND CAD
7 series · 8 of 15 positions shown · non-contrast
Comparison: Previous exam(s).

CLINICAL DATA: Screening.

EXAM:
DIGITAL SCREENING BILATERAL MAMMOGRAM WITH TOMO AND CAD

[L CC synth-2D]
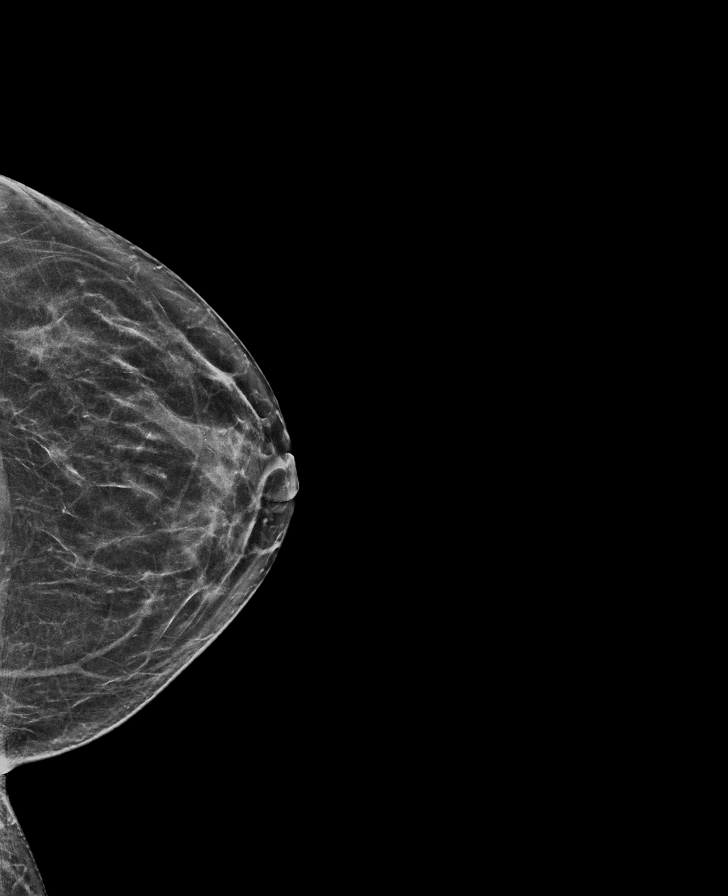

[R CC synth-2D]
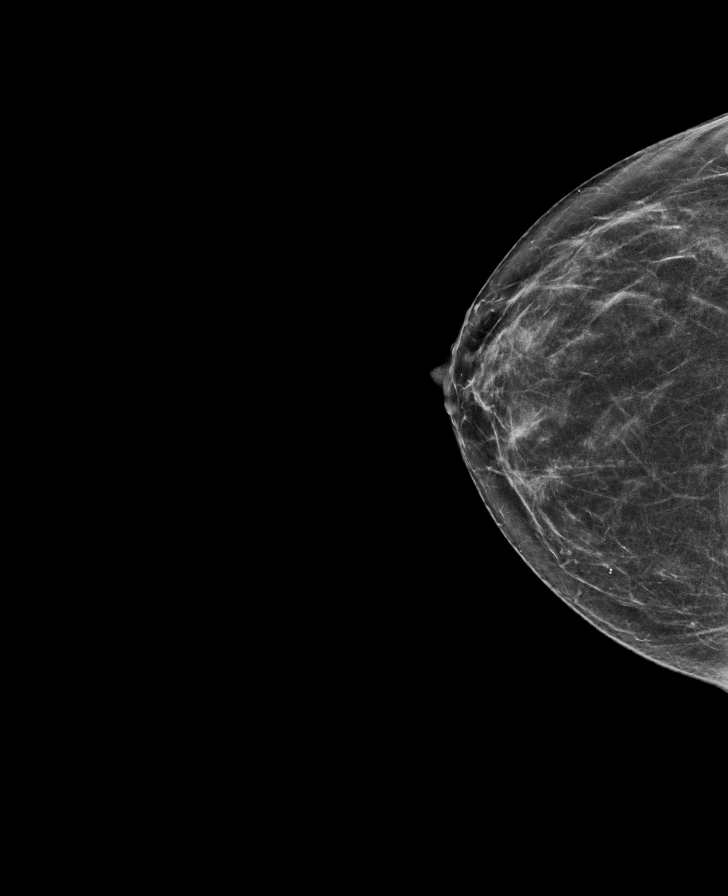

[L MLO synth-2D]
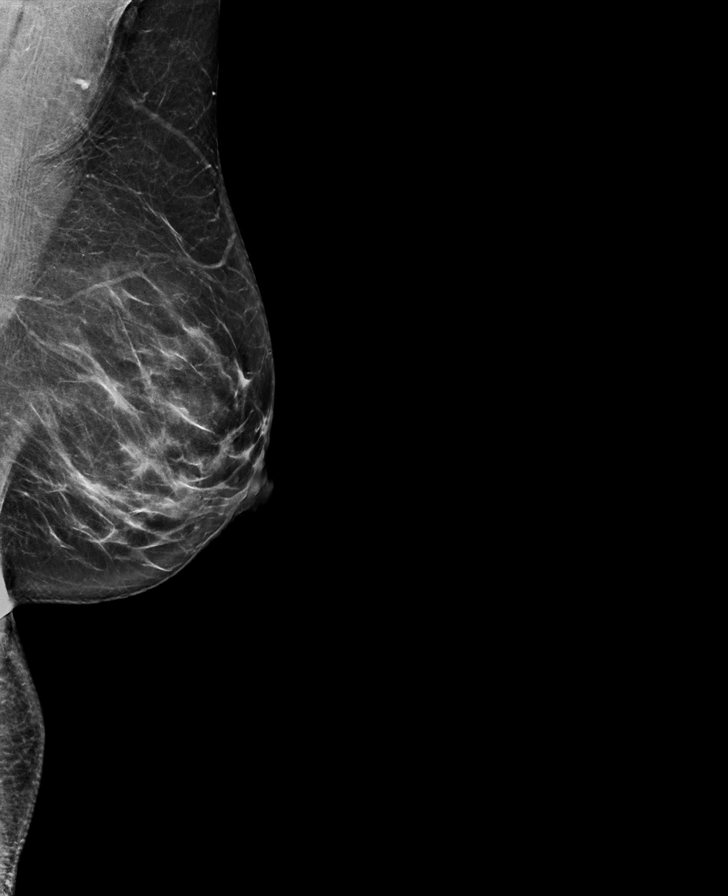

[R MLO synth-2D]
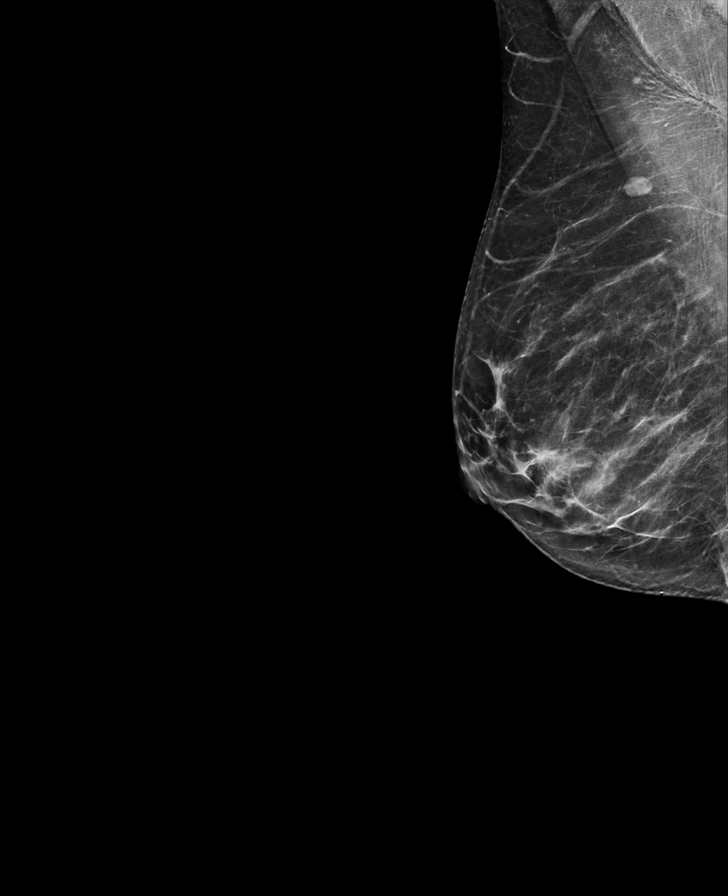

[R XCCL synth-2D]
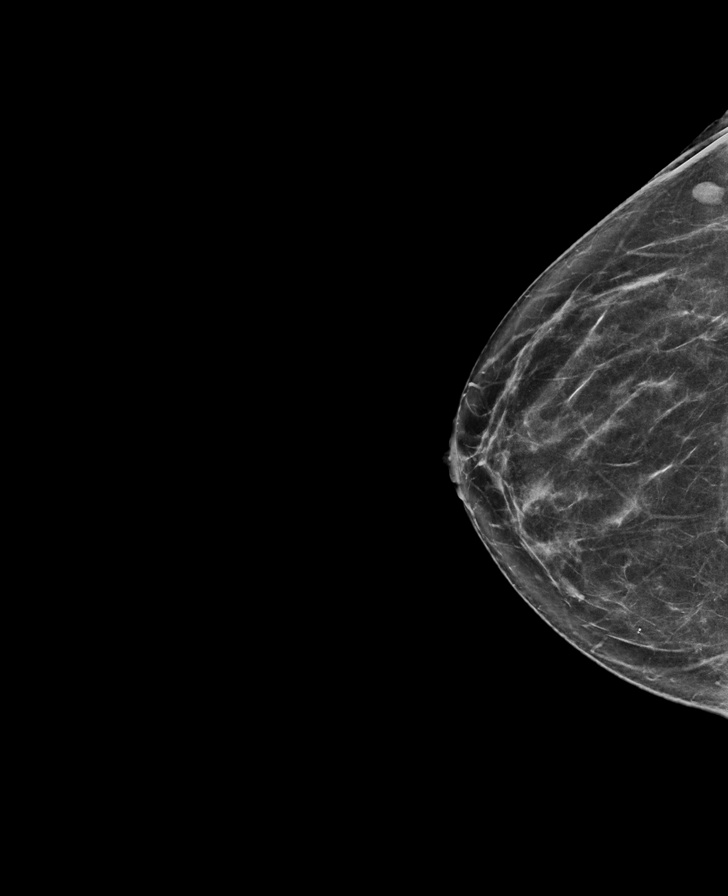

[R XCCL tomo · 2 of 70 frames shown]
[frame 23/70]
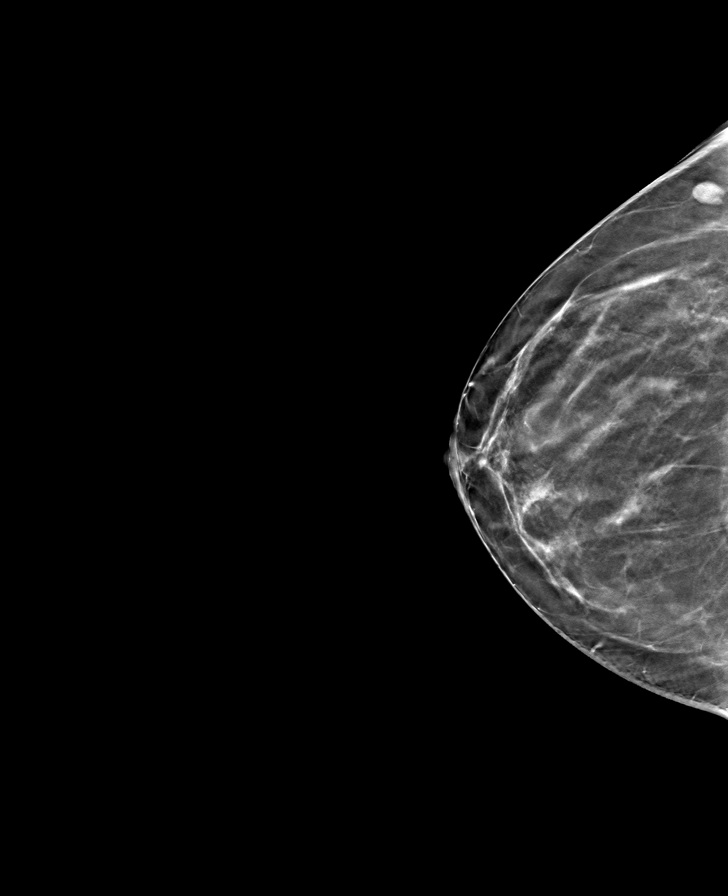
[frame 35/70]
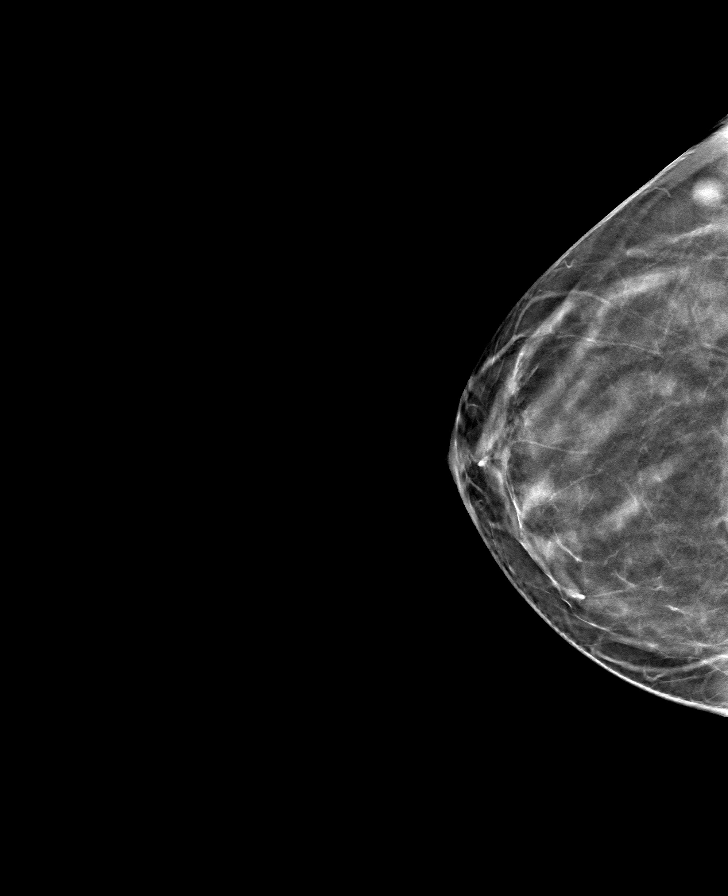

[R CC tomo · tomo slice 32/63.0]
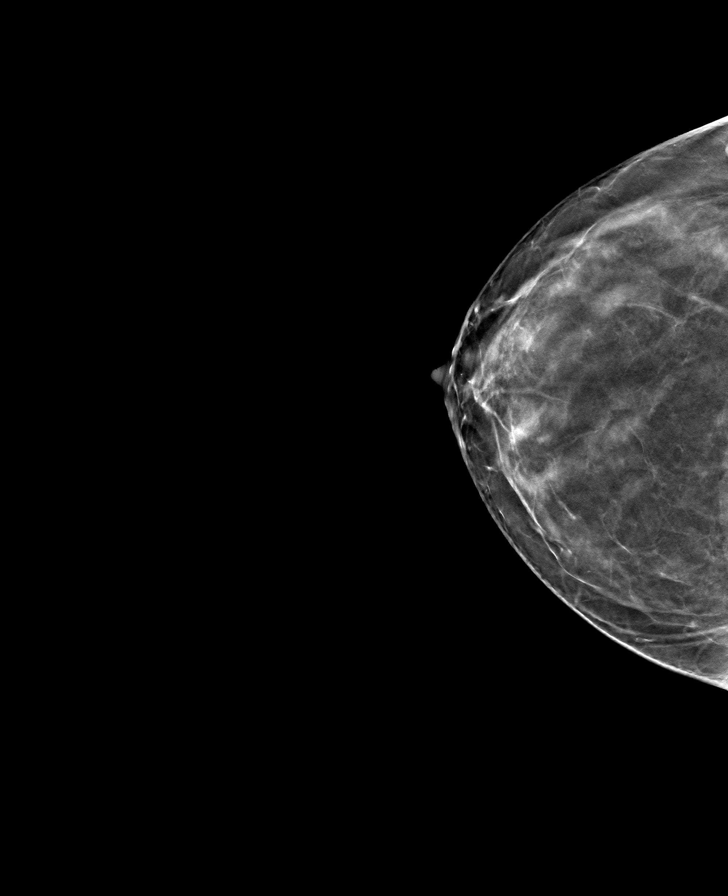

[8 of 15 positions shown; findings below may reference images not displayed]

ACR Breast Density Category c: The breast tissue is heterogeneously
dense, which may obscure small masses.
FINDINGS: There are no findings suspicious for malignancy. Images were
processed with CAD.
IMPRESSION: No mammographic evidence of malignancy. A result letter of this
screening mammogram will be mailed directly to the patient.

RECOMMENDATION:
Screening mammogram in one year. (Code:[id number])

BI-RADS CATEGORY  1: Negative.

## 2019-06-30 ENCOUNTER — Ambulatory Visit (INDEPENDENT_AMBULATORY_CARE_PROVIDER_SITE_OTHER): Payer: 59 | Admitting: Osteopathic Medicine

## 2019-06-30 ENCOUNTER — Other Ambulatory Visit: Payer: Self-pay

## 2019-06-30 ENCOUNTER — Encounter: Payer: Self-pay | Admitting: Osteopathic Medicine

## 2019-06-30 VITALS — BP 103/70 | HR 79 | Temp 98.9°F | Wt 146.2 lb

## 2019-06-30 DIAGNOSIS — Z0000 Encounter for general adult medical examination without abnormal findings: Secondary | ICD-10-CM

## 2019-06-30 DIAGNOSIS — Z23 Encounter for immunization: Secondary | ICD-10-CM

## 2019-06-30 MED ORDER — FLUOXETINE HCL 10 MG PO TABS: 10.0000 mg | ORAL_TABLET | Freq: Every day | ORAL | 4 refills | Status: DC

## 2019-06-30 MED ORDER — SUMATRIPTAN SUCCINATE 100 MG PO TABS: 50.0000 mg | ORAL_TABLET | Freq: Once | ORAL | 2 refills | Status: DC | PRN

## 2019-06-30 NOTE — Patient Instructions (Addendum)
General Preventive Care  Most recent routine screening lipids/other labs: ordered today .    Everyone should have blood pressure checked once per year.   Tobacco: don't!   Alcohol: responsible moderation is ok for most adults - if you have concerns about your alcohol intake, please talk to me!   Exercise: as tolerated to reduce risk of cardiovascular disease and diabetes. Strength training will also prevent osteoporosis.   Mental health: if need for mental health care (medicines, counseling, other), or concerns about moods, please let me know!   Sexual health: if need for STD testing, or if concerns with libido/pain problems, please let me or OBGYN know! If you need to discuss your birth control options, please let me or OBGYN know!   Advanced Directive: Living Will and/or Healthcare Power of Attorney recommended for all adults, regardless of age or health.  Vaccines  Flu vaccine: recommended for almost everyone, every fall.   Shingles vaccine: Shingrix recommended after age 45.  Pneumonia vaccines: Prevnar and Pneumovax recommended after age 77, or sooner if certain medical conditions.  Tetanus booster: Tdap recommended every 10 years. Due 2029.  Cancer screenings   Colon cancer screening: recommended for everyone at age 9, but some folks need a colonoscopy sooner if risk factors   Breast cancer screening: mammogram recommended at age 74 every other year at least, and annually after age 3.   Cervical cancer screening: Pap every 1 to 5 years depending on age and other risk factors. Can usually stop at age 36 or w/ hysterectomy.   Lung cancer screening: not needed since you quit smoking!   Infection screenings . HIV, Gonorrhea/Chlamydia: screening as needed . Hepatitis C: recommended for anyone born 24-1966 (not needed for you) . TB: certain at-risk populations, or depending on work requirements and/or travel history Other . Bone Density Test: recommended for women at age  79, men at age 29, sooner depending on risk factors

## 2019-06-30 NOTE — Progress Notes (Signed)
HPI: Tracy Burnett is a 48 y.o. female who  has no past medical history on file.  she presents to [facility] today, 06/30/19,  for chief complaint of: Annual physical   Patient here for annual physical / wellness exam.  See preventive care reviewed as below.  Last year's labs reviewed in detail with the patient.   Additional concerns today include:   No periods since 12/2018 Following w/ OBGYN   Low blood pressure - was unable to donate blood because of this not too long ago   BP today: 101/48  BP Readings from Last 3 Encounters:  06/29/18 (!) 103/59  01/28/17 (!) 98/52  11/07/16 101/72   78/52, 82/58 at blood bank last year - ???  No dizziness, lightheadedness, weakness.   Past medical, surgical, social and family history reviewed:  Patient Active Problem List   Diagnosis Date Noted  . Moderate episode of recurrent major depressive disorder ([place]) 01/28/2017  . History of anemia 01/28/2017  . History of migraine headaches 01/28/2017    Past Surgical History:  Procedure Laterality Date  . CESAREAN SECTION  11/08/2005    Social History   Tobacco Use  . Smoking status: Former Smoker    Quit date: 07/30/2000    Years since quitting: 18.9  . Smokeless tobacco: Never Used  Substance Use Topics  . Alcohol use: Yes    Comment: 5    Family History  Problem Relation Age of Onset  . Alcoholism Father   . Alcoholism Maternal Grandfather   . Leukemia Paternal Grandmother   . Alcoholism Paternal Grandfather      Current medication list and allergy/intolerance information reviewed:    Current Outpatient Medications  Medication Sig Dispense Refill  . CALCIUM PO Take by mouth.    Marland Kitchen FLUoxetine (PROZAC) 10 MG tablet TAKE 1 TABLET BY MOUTH EVERY DAY --NEEDS APPOINTMENT 90 tablet 3  . IRON PO Take 65 mg by mouth.    . norethindrone-ethinyl estradiol-iron (ESTROSTEP FE,TILIA FE,TRI-LEGEST FE) 1-20/1-30/1-35 MG-MCG tablet Take 1  tablet by mouth daily.    . SUMAtriptan (IMITREX) 100 MG tablet TAKE 0.5-1 TABLETS BY MOUTH ONCE FOR 1 DOSE. MAY REPEAT IN 2 HOURS IF HEADACHE PERSISTS OR RECURS. 10 tablet 0   No current facility-administered medications for this visit.     No Known Allergies    Review of Systems:  Constitutional:  No  fever, no chills, No recent illness, No unintentional weight changes. No significant fatigue.   HEENT: No  headache, no vision change, no hearing change, No sore throat, No  sinus pressure  Cardiac: No  chest pain, No  pressure, No palpitations, No  Orthopnea  Respiratory:  No  shortness of breath. No  Cough  Gastrointestinal: No  abdominal pain, No  nausea, No  vomiting,  No  blood in stool, No  diarrhea, No  constipation   Musculoskeletal: No new myalgia/arthralgia  Skin: No  Rash, No other wounds/concerning lesions  Genitourinary: No  incontinence, No  abnormal genital bleeding, No abnormal genital discharge  Hem/Onc: No  easy bruising/bleeding, No  abnormal lymph node  Endocrine: No cold intolerance,  No heat intolerance. No polyuria/polydipsia/polyphagia   Neurologic: No  weakness, No  dizziness, No  slurred speech/focal weakness/facial droop  Psychiatric: No  concerns with depression, No  concerns with anxiety, No sleep problems, No mood problems  Exam:  BP 103/70 (BP Location: Left Arm, Patient Position: Sitting, Cuff Size: Normal)   Pulse 79  Temp 98.9 F (37.2 C) (Oral)   Wt 146 lb 3.2 oz (66.3 kg)   BMI 26.74 kg/m   Constitutional: VS see above. General Appearance: alert, well-developed, well-nourished, NAD  Eyes: Normal lids and conjunctive, non-icteric sclera  Ears, Nose, Mouth, Throat: MMM, Normal external inspection ears/nares/mouth/lips/gums. TM normal bilaterally. Pharynx/tonsils no erythema, no exudate. Nasal mucosa normal.   Neck: No masses, trachea midline. No thyroid enlargement. No tenderness/mass appreciated. No lymphadenopathy  Respiratory:  Normal respiratory effort. no wheeze, no rhonchi, no rales  Cardiovascular: S1/S2 normal, no murmur, no rub/gallop auscultated. RRR. No lower extremity edema. Pedal pulse II/IV bilaterally DP and PT. No carotid bruit or JVD. No abdominal aortic bruit.  Gastrointestinal: Nontender, no masses. No hepatomegaly, no splenomegaly. No hernia appreciated. Bowel sounds normal. Rectal exam deferred.   Musculoskeletal: Gait normal. No clubbing/cyanosis of digits.   Neurological: Normal balance/coordination. No tremor. No cranial nerve deficit on limited exam. Motor and sensation intact and symmetric. Cerebellar reflexes intact.   Skin: warm, dry, intact. No rash/ulcer. No concerning nevi or subq nodules on limited exam.    Psychiatric: Normal judgment/insight. Normal mood and affect. Oriented x3.      ASSESSMENT/PLAN: The primary encounter diagnosis was Annual physical exam. A diagnosis of Need for influenza vaccination was also pertinent to this visit.  Orders Placed This Encounter  Procedures  . Flu Vaccine QUAD 6+ mos PF IM (Fluarix Quad PF)  . CBC  . COMPLETE METABOLIC PANEL WITH GFR  . Lipid panel      Meds ordered this encounter  Medications  . FLUoxetine (PROZAC) 10 MG tablet    Sig: Take 1 tablet (10 mg total) by mouth daily.    Dispense:  90 tablet    Refill:  4     Patient Instructions  General Preventive Care  Most recent routine screening lipids/other labs: ordered today .    Everyone should have blood pressure checked once per year.   Tobacco: don't!   Alcohol: responsible moderation is ok for most adults - if you have concerns about your alcohol intake, please talk to me!   Exercise: as tolerated to reduce risk of cardiovascular disease and diabetes. Strength training will also prevent osteoporosis.   Mental health: if need for mental health care (medicines, counseling, other), or concerns about moods, please let me know!   Sexual health: if need for STD  testing, or if concerns with libido/pain problems, please let me or OBGYN know! If you need to discuss your birth control options, please let me or OBGYN know!   Advanced Directive: Living Will and/or Healthcare Power of Attorney recommended for all adults, regardless of age or health.  Vaccines  Flu vaccine: recommended for almost everyone, every fall.   Shingles vaccine: Shingrix recommended after age 10.  Pneumonia vaccines: Prevnar and Pneumovax recommended after age 38, or sooner if certain medical conditions.  Tetanus booster: Tdap recommended every 10 years. Due 2029.  Cancer screenings   Colon cancer screening: recommended for everyone at age 44, but some folks need a colonoscopy sooner if risk factors   Breast cancer screening: mammogram recommended at age 56 every other year at least, and annually after age 45.   Cervical cancer screening: Pap every 1 to 5 years depending on age and other risk factors. Can usually stop at age 10 or w/ hysterectomy.   Lung cancer screening: not needed since you quit smoking!   Infection screenings . HIV, Gonorrhea/Chlamydia: screening as needed . Hepatitis C: recommended for  anyone born [id number] (not needed for you) . TB: certain at-risk populations, or depending on work requirements and/or travel history Other . Bone Density Test: recommended for women at age 49, men at age 49, sooner depending on risk factors    Immunization History  Administered Date(s) Administered  . Influenza Split 07/19/2014  . Influenza, Quadrivalent, Recombinant, Inj, Pf 08/12/2016  . Influenza,inj,Quad PF,6+ Mos 06/29/2018  . Tdap 06/29/2018     Visit summary with medication list and pertinent instructions was printed for patient to review. All questions at time of visit were answered - patient instructed to contact office with any additional concerns. ER/RTC precautions were reviewed with the patient.   Follow-up plan: Return in about 1 year (around  06/29/2020) for [facility] (call week prior to visit for lab orders).    Please note: voice recognition software was used to produce this document, and typos may escape review. Please contact Dr. Lyn HollingsheadAlexander for any needed clarifications.

## 2019-06-30 NOTE — Addendum Note (Signed)
Addended by: Maryla Morrow on: 06/30/2019 09:41 AM   Modules accepted: Orders

## 2019-07-01 LAB — COMPLETE METABOLIC PANEL WITH GFR
AG Ratio: 2.3 (calc) (ref 1.0–2.5)
ALT: 12 U/L (ref 6–29)
AST: 14 U/L (ref 10–35)
Albumin: 4.3 g/dL (ref 3.6–5.1)
Alkaline phosphatase (APISO): 38 U/L (ref 31–125)
BUN: 13 mg/dL (ref 7–25)
CO2: 28 mmol/L (ref 20–32)
Calcium: 9 mg/dL (ref 8.6–10.2)
Chloride: 105 mmol/L (ref 98–110)
Creat: 0.82 mg/dL (ref 0.50–1.10)
GFR, Est African American: 97 mL/min/{1.73_m2} (ref >=60)
GFR, Est Non African American: 84 mL/min/{1.73_m2} (ref >=60)
Globulin: 1.9 g/dL (calc) (ref 1.9–3.7)
Glucose, Bld: 87 mg/dL (ref 65–99)
Potassium: 4.5 mmol/L (ref 3.5–5.3)
Sodium: 141 mmol/L (ref 135–146)
Total Bilirubin: 0.7 mg/dL (ref 0.2–1.2)
Total Protein: 6.2 g/dL (ref 6.1–8.1)

## 2019-07-01 LAB — CBC
HCT: 42.5 % (ref 35.0–45.0)
Hemoglobin: 14.1 g/dL (ref 11.7–15.5)
MCH: 29.7 pg (ref 27.0–33.0)
MCHC: 33.2 g/dL (ref 32.0–36.0)
MCV: 89.5 fL (ref 80.0–100.0)
MPV: 11 fL (ref 7.5–12.5)
Platelets: 226 10*3/uL (ref 140–400)
RBC: 4.75 10*6/uL (ref 3.80–5.10)
RDW: 12.4 % (ref 11.0–15.0)
WBC: 6.4 10*3/uL (ref 3.8–10.8)

## 2019-07-01 LAB — LIPID PANEL
Cholesterol: 196 mg/dL (ref <200)
HDL: 60 mg/dL (ref >=50)
LDL Cholesterol (Calc): 120 mg/dL (calc) — ABNORMAL HIGH
Non-HDL Cholesterol (Calc): 136 mg/dL (calc) — ABNORMAL HIGH (ref <130)
Total CHOL/HDL Ratio: 3.3 (calc) (ref <5.0)
Triglycerides: 64 mg/dL (ref <150)

## 2019-07-11 ENCOUNTER — Other Ambulatory Visit: Payer: Self-pay | Admitting: Osteopathic Medicine

## 2019-07-11 DIAGNOSIS — Z1231 Encounter for screening mammogram for malignant neoplasm of breast: Secondary | ICD-10-CM

## 2019-08-03 ENCOUNTER — Ambulatory Visit (INDEPENDENT_AMBULATORY_CARE_PROVIDER_SITE_OTHER): Payer: 59

## 2019-08-03 ENCOUNTER — Other Ambulatory Visit: Payer: Self-pay

## 2019-08-03 DIAGNOSIS — Z1231 Encounter for screening mammogram for malignant neoplasm of breast: Secondary | ICD-10-CM | POA: Diagnosis not present

## 2020-02-27 ENCOUNTER — Telehealth: Payer: Self-pay | Admitting: Medical-Surgical

## 2020-02-27 NOTE — Telephone Encounter (Signed)
Patient overdue for pap according to our records. Please contact to see if she has had this done in the last 5 years. If so, please respond with date and provider that completed it. If not, please offer to schedule appointment for pap smear to be done. Patient sees OBGYN so she may want to do it there instead.

## 2020-02-29 NOTE — Telephone Encounter (Signed)
Addedin appointment notes in September to ask about PAP

## 2020-04-23 ENCOUNTER — Encounter: Payer: Self-pay | Admitting: Osteopathic Medicine

## 2020-04-23 ENCOUNTER — Ambulatory Visit (INDEPENDENT_AMBULATORY_CARE_PROVIDER_SITE_OTHER): Payer: 59 | Admitting: Osteopathic Medicine

## 2020-04-23 VITALS — BP 108/59 | HR 59 | Temp 98.0°F | Wt 142.0 lb

## 2020-04-23 DIAGNOSIS — M654 Radial styloid tenosynovitis [de Quervain]: Secondary | ICD-10-CM | POA: Diagnosis not present

## 2020-04-23 NOTE — Progress Notes (Signed)
HPI: Tracy Burnett is a 49 y.o. female otherwise healthy who presents to [facility] today, 04/23/20,  for chief complaint of: Left wrist pain.  Patient reports left wrist pain over the last 3 months, which has progressively worsened over the last several weeks. She localizes the pain to the lateral edge of her left wrist and left thumb and describes it as aching at rest, which becomes sharp with movements. She states the pain is worse with turning and twisting motions as well as typing, which she does a lot of for work. She states she has tried Aleve, ice, and rest, which have helped. She is left hand dominant and states the pain is interfering with her day to day activities.    Past medical, surgical, social and family history reviewed:  Patient Active Problem List   Diagnosis Date Noted  . Moderate episode of recurrent major depressive disorder ([facility]) 01/28/2017  . History of anemia 01/28/2017  . History of migraine headaches 01/28/2017    Past Surgical History:  Procedure Laterality Date  . CESAREAN SECTION  11/08/2005    Social History   Tobacco Use  . Smoking status: Former Smoker    Quit date: 07/30/2000    Years since quitting: 19.7  . Smokeless tobacco: Never Used  Substance Use Topics  . Alcohol use: Yes    Comment: 5    Family History  Problem Relation Age of Onset  . Alcoholism Father   . Alcoholism Maternal Grandfather   . Leukemia Paternal Grandmother   . Alcoholism Paternal Grandfather      Current medication list and allergy/intolerance information reviewed:    Current Outpatient Medications  Medication Sig Dispense Refill  . CALCIUM PO Take by mouth.    Marland Kitchen FLUoxetine (PROZAC) 10 MG tablet Take 1 tablet (10 mg total) by mouth daily. 90 tablet 4  . IRON PO Take 65 mg by mouth.    . norethindrone-ethinyl estradiol-iron (ESTROSTEP FE,TILIA FE,TRI-LEGEST FE) 1-20/1-30/1-35 MG-MCG tablet Take 1 tablet by mouth daily.    .  SUMAtriptan (IMITREX) 100 MG tablet Take 0.5-1 tablets (50-100 mg total) by mouth once as needed for up to 1 dose for migraine. May repeat in 2 hours if headache persists or recurs. 10 tablet 2   No current facility-administered medications for this visit.    No Known Allergies    Review of Systems:  Constitutional:  No  fever, no chills.   HEENT: No  headache, no vision change, no hearing change  Cardiac: No  chest pain  Respiratory:  No  shortness of breath. No  Cough  Gastrointestinal: No  abdominal pain, No  nausea, No  vomiting,  No  blood in stool, No  diarrhea, No  constipation   Musculoskeletal: Endorses left wrist pain  Skin: No  Rash, No other wounds/concerning lesions  Genitourinary: No  incontinence  Neurologic: No  weakness, No  dizziness  Psychiatric: No  concerns with depression, No  concerns with anxiety, No sleep problems, No mood problems  Exam:  BP (!) 108/59 (BP Location: Left Arm, Patient Position: Sitting)   Pulse (!) 59   Temp 98 F (36.7 C)   Wt 142 lb (64.4 kg)   SpO2 98%   BMI 25.97 kg/m   Constitutional: VS see above. General Appearance: alert, well-developed, well-nourished, NAD  Eyes: Normal lids and conjunctive, non-icteric sclera  Ears, Nose, Mouth, Throat: MMM, Normal external inspection ears/nares/mouth/lips/gums.  Neck: No masses, trachea midline. No thyroid enlargement. No  tenderness/mass appreciated. No lymphadenopathy  Respiratory: Normal respiratory effort. no wheeze, no rhonchi, no rales  Cardiovascular: S1/S2 normal, no murmur, no rub/gallop auscultated. RRR.  Gastrointestinal: Nontender, no masses.   Musculoskeletal: Tenderness to left lateral wrist near thumb base. Positive L finkelstein, some numbness in L thumb w/ phalen's. Gait normal. No clubbing/cyanosis of digits.   Neurological: Normal balance/coordination. No tremor. No cranial nerve deficit on limited exam.   Skin: warm, dry, intact. No rash/ulcer. No  concerning nevi or subq nodules on limited exam.    Psychiatric: Normal judgment/insight. Normal mood and affect. Oriented x3.    No results found for this or any previous visit (from the past 72 hour(s)).  No results found.   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   Description of pain and positive finkelstein test indicate likely tendonitis. Recommended patient continue ice and NSAIDs, and rest as much as able. Patient fitted for left wrist brace with thumb immobilization. If interfering with work responsibilities, will re-evaluate brace options.  Follow up with sports medicine in 4 weeks if pain is not improved to discuss option for joint injection.   Home rehab exercises printed   No orders of the defined types were placed in this encounter.   No orders of the defined types were placed in this encounter.   Patient Instructions  Brace as much as you're able throughout the day for 2 weeks If brace bothersome, call me and we can switch it to one without the thumb  Can continue Ibuprofen 600-800 mg three times per day   See printed info for home rehab, ice/heat info         Visit summary with medication list and pertinent instructions was printed for patient to review. All questions at time of visit were answered - patient instructed to contact office with any additional concerns or updates. ER/RTC precautions were reviewed with the patient.      Please note: voice recognition software was used to produce this document, and typos may escape review. Please contact Dr. Lyn Hollingshead for any needed clarifications.     Follow-up plan: Return in about 4 weeks (around 05/21/2020) for VISIT WITH SPORTS MEDICINE FOR WRIST PAIN IF NO BETTER .

## 2020-04-23 NOTE — Patient Instructions (Signed)
Brace as much as you're able throughout the day for 2 weeks If brace bothersome, call me and we can switch it to one without the thumb  Can continue Ibuprofen 600-800 mg three times per day   See printed info for home rehab, ice/heat info

## 2020-06-10 ENCOUNTER — Other Ambulatory Visit: Payer: Self-pay | Admitting: Osteopathic Medicine

## 2020-07-01 ENCOUNTER — Encounter: Payer: 59 | Admitting: Osteopathic Medicine

## 2020-07-02 ENCOUNTER — Encounter: Payer: 59 | Admitting: Osteopathic Medicine

## 2020-07-08 ENCOUNTER — Other Ambulatory Visit: Payer: Self-pay | Admitting: Osteopathic Medicine

## 2020-07-24 ENCOUNTER — Ambulatory Visit (INDEPENDENT_AMBULATORY_CARE_PROVIDER_SITE_OTHER): Payer: 59

## 2020-07-24 ENCOUNTER — Other Ambulatory Visit: Payer: Self-pay

## 2020-07-24 ENCOUNTER — Ambulatory Visit (INDEPENDENT_AMBULATORY_CARE_PROVIDER_SITE_OTHER): Payer: 59 | Admitting: Sports Medicine

## 2020-07-24 DIAGNOSIS — M654 Radial styloid tenosynovitis [de Quervain]: Secondary | ICD-10-CM | POA: Diagnosis not present

## 2020-07-24 NOTE — Assessment & Plan Note (Signed)
This is a very pleasant 49 year old female, she has a several month history of pain in her left wrist, radial aspect, she has had conservative treatment in the form of NSAIDs, bracing, with no improvement. Today she has tenderness over her first extensor compartment distal to the radial styloid, surprisingly there is a negative Finkelstein sign. Otherwise her wrist exam is normal, only minimal swelling. Today we performed a first extensor compartment injection under ultrasound guidance, continue brace for the next day or 2 and return to see me in 1 month. I did print out some of the rehab exercises for her.

## 2020-07-24 NOTE — Progress Notes (Signed)
    Procedures performed today:    Procedure: Real-time Ultrasound Guided injection of the left first extensor compartment Device: Samsung [id number]  Verbal informed consent obtained.  Time-out conducted.  Noted no overlying erythema, induration, or other signs of local infection.  Skin prepped in a sterile fashion.  Local anesthesia: Topical Ethyl chloride.  With sterile technique and under real time ultrasound guidance:  Using a 25-gauge needle advanced into the first extensor compartment between the abductor pollicis longus and the extensor pollicis brevis, I then injected 1 cc kenalog 40, 1 cc lidocaine, 1 cc bupivacaine.   Completed without difficulty  Pain immediately resolved suggesting accurate placement of the medication.  Advised to call if fevers/chills, erythema, induration, drainage, or persistent bleeding.  Images permanently stored and available for review in PACS.  Impression: Technically successful ultrasound guided injection.  Independent interpretation of notes and tests performed by another provider:   None.  Brief History, Exam, Impression, and Recommendations:    De Quervain's tenosynovitis, left This is a very pleasant 49 year old female, she has a several month history of pain in her left wrist, radial aspect, she has had conservative treatment in the form of NSAIDs, bracing, with no improvement. Today she has tenderness over her first extensor compartment distal to the radial styloid, surprisingly there is a negative Finkelstein sign. Otherwise her wrist exam is normal, only minimal swelling. Today we performed a first extensor compartment injection under ultrasound guidance, continue brace for the next day or 2 and return to see me in 1 month. I did print out some of the rehab exercises for her.    ___________________________________________ Ihor Austin. Benjamin Stain, M.D., ABFM., CAQSM. Primary Care and Sports Medicine [facility]  [facility]  Adjunct Instructor of Family Medicine  [employer]

## 2020-07-25 ENCOUNTER — Encounter: Payer: Self-pay | Admitting: Osteopathic Medicine

## 2020-07-25 ENCOUNTER — Ambulatory Visit (INDEPENDENT_AMBULATORY_CARE_PROVIDER_SITE_OTHER): Payer: 59 | Admitting: Osteopathic Medicine

## 2020-07-25 VITALS — BP 102/69 | HR 79 | Temp 98.0°F | Ht 62.0 in | Wt 142.0 lb

## 2020-07-25 DIAGNOSIS — Z23 Encounter for immunization: Secondary | ICD-10-CM

## 2020-07-25 DIAGNOSIS — Z0000 Encounter for general adult medical examination without abnormal findings: Secondary | ICD-10-CM

## 2020-07-25 MED ORDER — SUMATRIPTAN SUCCINATE 100 MG PO TABS: 50.0000 mg | ORAL_TABLET | Freq: Once | ORAL | 5 refills | Status: DC | PRN

## 2020-07-25 MED ORDER — FLUOXETINE HCL 10 MG PO TABS
10.0000 mg | ORAL_TABLET | Freq: Every day | ORAL | 3 refills | Status: DC
Start: 2020-07-25 — End: 2021-08-01

## 2020-07-25 NOTE — Progress Notes (Signed)
Tracy Burnett is a 49 y.o. female who presents to  [facility] at [facility]  today, 07/25/20, seeking care for the following:  . Annual pysical      ASSESSMENT & PLAN with other pertinent findings:  The primary encounter diagnosis was Annual physical exam. A diagnosis of Flu vaccine need was also pertinent to this visit.     Patient Instructions  General Preventive Care  Most recent routine screening labs: ordered today.   Blood pressure goal 130/80 or less.   Tobacco: don't!   Alcohol: responsible moderation is ok for most adults - if you have concerns about your alcohol intake, please talk to me!   Exercise: as tolerated to reduce risk of cardiovascular disease and diabetes. Strength training will also prevent osteoporosis.   Mental health: if need for mental health care (medicines, counseling, other), or concerns about moods, please let me know!   Sexual / Reproductive health: if need for STD testing, or if concerns with libido/pain problems, please let me or OBGYN know!   Advanced Directive: Living Will and/or Healthcare Power of Attorney recommended for all adults, regardless of age or health.  Vaccines  Flu vaccine: for almost everyone, every fall.   Shingles vaccine: after age 15.   Pneumonia vaccines: after age 26, or sooner if certain medical conditions.  Tetanus booster: every 10 years - due 2029  COVID vaccine: THANKS for getting your vaccine! :)  Cancer screenings   Colon cancer screening: for everyone age 13-75. Colonoscopy available for all, many people also qualify for the Cologuard stool test   Breast cancer screening: mammogram annually age 49-75   Cervical cancer screening: Pap per OBGYN, usually every 1-5 years  Lung cancer screening: not needed since quite >15 years ago  Infection screenings  . HIV: recommended screening at least once age 38-65, more often as needed. . Gonorrhea/Chlamydia:  screening as needed . Hepatitis C: recommended once for everyone age 74-75 . TB: certain at-risk populations, or depending on work requirements and/or travel history Other . Bone Density Test: recommended for women at age 27   Orders Placed This Encounter  Procedures  . Flu Vaccine QUAD 6+ mos PF IM (Fluarix Quad PF)  . Varicella-zoster vaccine IM (Shingrix)  . CBC  . COMPLETE METABOLIC PANEL WITH GFR  . Lipid panel    Meds ordered this encounter  Medications  . FLUoxetine (PROZAC) 10 MG tablet    Sig: Take 1 tablet (10 mg total) by mouth daily.    Dispense:  90 tablet    Refill:  3  . SUMAtriptan (IMITREX) 100 MG tablet    Sig: Take 0.5-1 tablets (50-100 mg total) by mouth once as needed for up to 1 dose for migraine. May repeat in 2 hours if headache persists or recurs.    Dispense:  10 tablet    Refill:  5    Constitutional:  . VSS, see nurse notes . General Appearance: alert, well-developed, well-nourished, NAD Eyes: Marland Kitchen Normal lids and conjunctive, non-icteric sclera . PERRLA Ears, Nose, Mouth, Throat: . Normal appearance . Normal external auditory canal and TM bilaterally Neck: . No masses, trachea midline . No thyroid enlargement/tenderness/mass appreciated Respiratory: . Normal respiratory effort . Breath sounds normal, no wheeze/rhonchi/rales Cardiovascular: . S1/S2 normal, no murmur/rub/gallop auscultated . No lower extremity edema Gastrointestinal: . Nontender, no masses . No hepatomegaly, no splenomegaly . No hernia appreciated Musculoskeletal:  . Gait normal . No clubbing/cyanosis of digits Neurological: . No  cranial nerve deficit on limited exam . Motor and sensation intact and symmetric Psychiatric: . Normal judgment/insight . Normal mood and affect    Follow-up instructions: Return in about 1 year (around 07/25/2021) for [place] (call week prior to visit for lab  orders).                                         BP 102/69 (BP Location: Left Arm, Patient Position: Sitting)   Pulse 79   Temp 98 F (36.7 C)   Ht 5\' 2"  (1.575 m)   Wt 142 lb (64.4 kg)   SpO2 100%   BMI 25.97 kg/m   Current Meds  Medication Sig  . CALCIUM PO Take by mouth.  FLUoxetine (PROZAC) 10 MG tablet Take 1 tablet (10 mg total) by mouth daily.  . IRON PO Take 65 mg by mouth.  . norethindrone-ethinyl estradiol-iron (ESTROSTEP FE,TILIA FE,TRI-LEGEST FE) 1-20/1-30/1-35 MG-MCG tablet Take 1 tablet by mouth daily.  . SUMAtriptan (IMITREX) 100 MG tablet Take 0.5-1 tablets (50-100 mg total) by mouth once as needed for up to 1 dose for migraine. May repeat in 2 hours if headache persists or recurs.  . [DISCONTINUED] FLUoxetine (PROZAC) 10 MG tablet TAKE 1 TABLET BY MOUTH EVERY DAY  . [DISCONTINUED] SUMAtriptan (IMITREX) 100 MG tablet Take 0.5-1 tablets (50-100 mg total) by mouth once as needed for up to 1 dose for migraine. May repeat in 2 hours if headache persists or recurs.    Results for orders placed or performed in visit on 07/25/20 (from the past 72 hour(s))  CBC     Status: Abnormal   Collection Time: 07/25/20  9:02 AM  Result Value Ref Range   WBC 9.4 3.8 - 10.8 Thousand/uL   RBC 5.01 3.80 - 5.10 Million/uL   Hemoglobin 14.6 11.7 - 15.5 g/dL   HCT 09/24/20 (H) 35 - 45 %   MCV 90.2 80.0 - 100.0 fL   MCH 29.1 27.0 - 33.0 pg   MCHC 32.3 32.0 - 36.0 g/dL   RDW 51.7 61.6 - 07.3 %   Platelets 216 140 - 400 Thousand/uL   MPV 11.3 7.5 - 12.5 fL    71.0 LIMITED JOINT SPACE STRUCTURES UP LEFT  Result Date: 07/24/2020 Procedure: Real-time Ultrasound Guided injection of the left first extensor compartment Device: Samsung HS60 Verbal informed consent obtained. Time-out conducted. Noted no overlying erythema, induration, or other signs of local infection. Skin prepped in a sterile fashion. Local anesthesia: Topical Ethyl chloride. With sterile  technique and under real time ultrasound guidance:  Using a 25-gauge needle advanced into the first extensor compartment between the abductor pollicis longus and the extensor pollicis brevis, I then injected 1 cc kenalog 40, 1 cc lidocaine, 1 cc bupivacaine.  Completed without difficulty Pain immediately resolved suggesting accurate placement of the medication. Advised to call if fevers/chills, erythema, induration, drainage, or persistent bleeding. Images permanently stored and available for review in PACS. Impression: Technically successful ultrasound guided injection.      All questions at time of visit were answered - patient instructed to contact office with any additional concerns or updates.  ER/RTC precautions were reviewed with the patient as applicable.   Please note: voice recognition software was used to produce this document, and typos may escape review. Please contact Dr. 09/23/2020 for any needed clarifications.

## 2020-07-25 NOTE — Patient Instructions (Signed)
General Preventive Care  Most recent routine screening labs: ordered today.   Blood pressure goal 130/80 or less.   Tobacco: don't!   Alcohol: responsible moderation is ok for most adults - if you have concerns about your alcohol intake, please talk to me!   Exercise: as tolerated to reduce risk of cardiovascular disease and diabetes. Strength training will also prevent osteoporosis.   Mental health: if need for mental health care (medicines, counseling, other), or concerns about moods, please let me know!   Sexual / Reproductive health: if need for STD testing, or if concerns with libido/pain problems, please let me or OBGYN know!   Advanced Directive: Living Will and/or Healthcare Power of Attorney recommended for all adults, regardless of age or health.  Vaccines  Flu vaccine: for almost everyone, every fall.   Shingles vaccine: after age 64.   Pneumonia vaccines: after age 48, or sooner if certain medical conditions.  Tetanus booster: every 10 years - due 2029  COVID vaccine: THANKS for getting your vaccine! :)  Cancer screenings   Colon cancer screening: for everyone age 81-75. Colonoscopy available for all, many people also qualify for the Cologuard stool test   Breast cancer screening: mammogram annually age 77-75   Cervical cancer screening: Pap per OBGYN, usually every 1-5 years  Lung cancer screening: not needed since quite >15 years ago  Infection screenings  . HIV: recommended screening at least once age 24-65, more often as needed. . Gonorrhea/Chlamydia: screening as needed . Hepatitis C: recommended once for everyone age 66-75 . TB: certain at-risk populations, or depending on work requirements and/or travel history Other . Bone Density Test: recommended for women at age 35

## 2020-07-26 LAB — CBC
HCT: 45.2 % — ABNORMAL HIGH (ref 35.0–45.0)
Hemoglobin: 14.6 g/dL (ref 11.7–15.5)
MCH: 29.1 pg (ref 27.0–33.0)
MCHC: 32.3 g/dL (ref 32.0–36.0)
MCV: 90.2 fL (ref 80.0–100.0)
MPV: 11.3 fL (ref 7.5–12.5)
Platelets: 216 10*3/uL (ref 140–400)
RBC: 5.01 10*6/uL (ref 3.80–5.10)
RDW: 12.4 % (ref 11.0–15.0)
WBC: 9.4 10*3/uL (ref 3.8–10.8)

## 2020-07-26 LAB — COMPLETE METABOLIC PANEL WITH GFR
AG Ratio: 2.2 (calc) (ref 1.0–2.5)
ALT: 15 U/L (ref 6–29)
AST: 15 U/L (ref 10–35)
Albumin: 4.4 g/dL (ref 3.6–5.1)
Alkaline phosphatase (APISO): 41 U/L (ref 37–153)
BUN: 17 mg/dL (ref 7–25)
CO2: 29 mmol/L (ref 20–32)
Calcium: 9.4 mg/dL (ref 8.6–10.4)
Chloride: 106 mmol/L (ref 98–110)
Creat: 0.83 mg/dL (ref 0.50–1.05)
GFR, Est African American: 95 mL/min/{1.73_m2} (ref >=60)
GFR, Est Non African American: 82 mL/min/{1.73_m2} (ref >=60)
Globulin: 2 g/dL (calc) (ref 1.9–3.7)
Glucose, Bld: 94 mg/dL (ref 65–99)
Potassium: 4.4 mmol/L (ref 3.5–5.3)
Sodium: 140 mmol/L (ref 135–146)
Total Bilirubin: 0.4 mg/dL (ref 0.2–1.2)
Total Protein: 6.4 g/dL (ref 6.1–8.1)

## 2020-07-26 LAB — LIPID PANEL
Cholesterol: 214 mg/dL — ABNORMAL HIGH (ref <200)
HDL: 64 mg/dL (ref >=50)
LDL Cholesterol (Calc): 133 mg/dL (calc) — ABNORMAL HIGH
Non-HDL Cholesterol (Calc): 150 mg/dL (calc) — ABNORMAL HIGH (ref <130)
Total CHOL/HDL Ratio: 3.3 (calc) (ref <5.0)
Triglycerides: 78 mg/dL (ref <150)

## 2020-08-21 ENCOUNTER — Other Ambulatory Visit: Payer: Self-pay

## 2020-08-21 ENCOUNTER — Ambulatory Visit (INDEPENDENT_AMBULATORY_CARE_PROVIDER_SITE_OTHER): Payer: 59 | Admitting: Sports Medicine

## 2020-08-21 ENCOUNTER — Telehealth: Payer: Self-pay | Admitting: Osteopathic Medicine

## 2020-08-21 DIAGNOSIS — M654 Radial styloid tenosynovitis [de Quervain]: Secondary | ICD-10-CM

## 2020-08-21 NOTE — Assessment & Plan Note (Signed)
Tracy Burnett returns, she is a pleasant 49 year old female, she had a de Quervain's type process in her left wrist, I injected her left first extensor compartment at the last visit she returns today pain-free, she can continue her brace at night, rehab exercises and activities as tolerated, return as needed.

## 2020-08-21 NOTE — Progress Notes (Signed)
    Procedures performed today:    None.  Independent interpretation of notes and tests performed by another provider:   None.  Brief History, Exam, Impression, and Recommendations:    De Quervain's tenosynovitis, left Tracy Burnett returns, she is a pleasant 49 year old female, she had a de Quervain's type process in her left wrist, I injected her left first extensor compartment at the last visit she returns today pain-free, she can continue her brace at night, rehab exercises and activities as tolerated, return as needed.    ___________________________________________ Ihor Austin. Benjamin Stain, M.D., ABFM., CAQSM. Primary Care and Sports Medicine [facility]  Adjunct Instructor of Family Medicine  [employer]

## 2020-08-21 NOTE — Telephone Encounter (Signed)
Patient was in for a visit with Dr T and stated she had missed a few calls regarding lab work results and wanted a call back about this. AM

## 2020-08-22 NOTE — Telephone Encounter (Signed)
See labs 

## 2020-08-27 ENCOUNTER — Other Ambulatory Visit: Payer: Self-pay | Admitting: Osteopathic Medicine

## 2020-08-27 DIAGNOSIS — Z1231 Encounter for screening mammogram for malignant neoplasm of breast: Secondary | ICD-10-CM

## 2020-09-03 NOTE — Telephone Encounter (Signed)
Task completed. Pt viewed results via MyChart and was informed directly.

## 2020-09-06 ENCOUNTER — Other Ambulatory Visit: Payer: Self-pay | Admitting: Osteopathic Medicine

## 2020-10-24 ENCOUNTER — Ambulatory Visit: Payer: 59

## 2021-08-01 ENCOUNTER — Other Ambulatory Visit: Payer: Self-pay | Admitting: Osteopathic Medicine

## 2021-08-22 ENCOUNTER — Other Ambulatory Visit: Payer: Self-pay

## 2021-08-22 DIAGNOSIS — Z1231 Encounter for screening mammogram for malignant neoplasm of breast: Secondary | ICD-10-CM

## 2021-08-30 ENCOUNTER — Other Ambulatory Visit: Payer: Self-pay | Admitting: Family Medicine

## 2021-10-24 ENCOUNTER — Other Ambulatory Visit: Payer: Self-pay | Admitting: Osteopathic Medicine

## 2021-10-24 ENCOUNTER — Other Ambulatory Visit: Payer: Self-pay | Admitting: Obstetrics

## 2021-10-24 DIAGNOSIS — Z1231 Encounter for screening mammogram for malignant neoplasm of breast: Secondary | ICD-10-CM

## 2021-10-29 ENCOUNTER — Ambulatory Visit: Payer: Self-pay

## 2022-02-28 ENCOUNTER — Other Ambulatory Visit: Payer: Self-pay | Admitting: Family Medicine

## 2022-03-02 NOTE — Telephone Encounter (Signed)
Please contact pt to TOC to new PCP. Unable to provide medication refills. Thanks ?

## 2022-03-02 NOTE — Telephone Encounter (Signed)
LVM for patient to schedule a TOC visit. AMUCK ?

## 2022-04-18 ENCOUNTER — Other Ambulatory Visit: Payer: Self-pay | Admitting: Family Medicine

## 2022-05-20 ENCOUNTER — Ambulatory Visit: Payer: Self-pay | Admitting: Family Medicine

## 2022-06-08 NOTE — Progress Notes (Unsigned)
Established Patient Office Visit  Subjective   Patient ID: Tracy Burnett, female    DOB: 1971/03/21  Age: 51 y.o. MRN: [id number]  Chief Complaint  Patient presents with   Establish Care    HPI Pt is here to transfer care. She is here for her wellness exam.    She is doing well overall. 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 She craves salty foods. She exercises 2-3x a week walking with her husband.    She is up to date on her mammogram and cervical cancer screening.  Is due for colonoscopy.   Father had some cancerous polyps  Grandmother had lymphoma HTN? When older both parents  Review of Systems  Constitutional:  Negative for chills and fever.  Respiratory:  Negative for cough and shortness of breath.   Cardiovascular:  Negative for chest pain.  Neurological:  Negative for headaches.      Objective:     BP 96/63   Pulse 84   Ht 5\' 2"  (1.575 m)   Wt 165 lb (74.8 kg)   SpO2 98%   BMI 30.18 kg/m    Physical Exam Vitals and nursing note reviewed.  Constitutional:      General: She is not in acute distress.    Appearance: Normal appearance.  HENT:     Head: Normocephalic and atraumatic.     Right Ear: External ear normal.     Left Ear: External ear normal.     Nose: Nose normal.  Eyes:     Conjunctiva/sclera: Conjunctivae normal.  Cardiovascular:     Rate and Rhythm: Normal rate and regular rhythm.     Pulses: Normal pulses.     Heart sounds: Normal heart sounds.  Pulmonary:     Effort: Pulmonary effort is normal.     Breath sounds: Normal breath sounds.  Abdominal:     General: Abdomen is flat. Bowel sounds are normal.     Palpations: Abdomen is soft.  Musculoskeletal:        General: Normal range of motion.     Cervical back: Normal range of motion and neck supple. No tenderness.     Comments: 5/5 muscle strength upper and lower extremity bilaterally  Lymphadenopathy:      Cervical: No cervical adenopathy.  Neurological:     General: No focal deficit present.     Mental Status: She is alert and oriented to person, place, and time.  Psychiatric:        Mood and Affect: Mood normal.        Behavior: Behavior normal.        Thought Content: Thought content normal.        Judgment: Judgment normal.     Wart removal performed using cryotherapy to left great toe.   No results found for any visits on 06/09/22.    The 10-year ASCVD risk score (Arnett DK, et al., 2019) is: 0.8%    Assessment & Plan:   Problem List Items Addressed This Visit       Musculoskeletal and Integument   Plantar wart of left foot    - plantar wart on left great toe  - removed with cryotherapy  - discussed follow up care. If pt does not notice it is falling off in about a month she may need to return for repeat procedure        Other   Routine adult health  maintenance    - pt will get labs for HIV screening, one time hep c screening, cbc, cmp, and lipid panel  - up to date on mammogram and pap smear  - needs colonoscopy--have ordered  - due for shingles vaccine--given today - due for flu vaccine--given today       Relevant Orders   Lipid panel   COMPLETE METABOLIC PANEL WITH GFR   CBC   Other Visit Diagnoses     Encounter for hepatitis C screening test for low risk patient    -  Primary   Relevant Orders   Hepatitis C Antibody   Encounter for screening for HIV       Relevant Orders   HIV antibody (with reflex)   Screening for colon cancer       Relevant Orders   Ambulatory referral to Gastroenterology       Return in about 1 year (around 06/10/2023) for wellness exam.    Charlton Amor, DO

## 2022-06-09 ENCOUNTER — Ambulatory Visit (INDEPENDENT_AMBULATORY_CARE_PROVIDER_SITE_OTHER): Payer: BLUE CROSS/BLUE SHIELD | Admitting: Family Medicine

## 2022-06-09 VITALS — BP 96/63 | HR 84 | Ht 62.0 in | Wt 165.0 lb

## 2022-06-09 DIAGNOSIS — Z1159 Encounter for screening for other viral diseases: Secondary | ICD-10-CM

## 2022-06-09 DIAGNOSIS — Z23 Encounter for immunization: Secondary | ICD-10-CM | POA: Diagnosis not present

## 2022-06-09 DIAGNOSIS — Z0000 Encounter for general adult medical examination without abnormal findings: Secondary | ICD-10-CM | POA: Diagnosis not present

## 2022-06-09 DIAGNOSIS — Z114 Encounter for screening for human immunodeficiency virus [HIV]: Secondary | ICD-10-CM

## 2022-06-09 DIAGNOSIS — B070 Plantar wart: Secondary | ICD-10-CM

## 2022-06-09 DIAGNOSIS — Z1211 Encounter for screening for malignant neoplasm of colon: Secondary | ICD-10-CM | POA: Diagnosis not present

## 2022-06-09 NOTE — Assessment & Plan Note (Signed)
-   pt will get labs for HIV screening, one time hep c screening, cbc, cmp, and lipid panel  - up to date on mammogram and pap smear  - needs colonoscopy--have ordered  - due for shingles vaccine--given today - due for flu vaccine--given today

## 2022-06-09 NOTE — Addendum Note (Signed)
Addended by: Howard Pouch on: 06/09/2022 04:50 PM   Modules accepted: Orders

## 2022-06-09 NOTE — Assessment & Plan Note (Signed)
-   plantar wart on left great toe  - removed with cryotherapy  - discussed follow up care. If pt does not notice it is falling off in about a month she may need to return for repeat procedure

## 2022-06-10 LAB — CBC
HCT: 43.8 % (ref 35.0–45.0)
Hemoglobin: 14.8 g/dL (ref 11.7–15.5)
MCH: 29.9 pg (ref 27.0–33.0)
MCHC: 33.8 g/dL (ref 32.0–36.0)
MCV: 88.5 fL (ref 80.0–100.0)
MPV: 11.1 fL (ref 7.5–12.5)
Platelets: 204 10*3/uL (ref 140–400)
RBC: 4.95 10*6/uL (ref 3.80–5.10)
RDW: 12.6 % (ref 11.0–15.0)
WBC: 5.3 10*3/uL (ref 3.8–10.8)

## 2022-06-10 LAB — LIPID PANEL
Cholesterol: 235 mg/dL — ABNORMAL HIGH (ref <200)
HDL: 69 mg/dL (ref >=50)
LDL Cholesterol (Calc): 148 mg/dL (calc) — ABNORMAL HIGH
Non-HDL Cholesterol (Calc): 166 mg/dL (calc) — ABNORMAL HIGH (ref <130)
Total CHOL/HDL Ratio: 3.4 (calc) (ref <5.0)
Triglycerides: 77 mg/dL (ref <150)

## 2022-06-10 LAB — COMPLETE METABOLIC PANEL WITH GFR
AG Ratio: 2 (calc) (ref 1.0–2.5)
ALT: 18 U/L (ref 6–29)
AST: 17 U/L (ref 10–35)
Albumin: 4.2 g/dL (ref 3.6–5.1)
Alkaline phosphatase (APISO): 54 U/L (ref 37–153)
BUN: 16 mg/dL (ref 7–25)
CO2: 28 mmol/L (ref 20–32)
Calcium: 9.5 mg/dL (ref 8.6–10.4)
Chloride: 106 mmol/L (ref 98–110)
Creat: 0.91 mg/dL (ref 0.50–1.03)
Globulin: 2.1 g/dL (calc) (ref 1.9–3.7)
Glucose, Bld: 92 mg/dL (ref 65–99)
Potassium: 5 mmol/L (ref 3.5–5.3)
Sodium: 143 mmol/L (ref 135–146)
Total Bilirubin: 0.5 mg/dL (ref 0.2–1.2)
Total Protein: 6.3 g/dL (ref 6.1–8.1)
eGFR: 76 mL/min/{1.73_m2} (ref >=60)

## 2022-06-10 LAB — HEPATITIS C ANTIBODY: Hepatitis C Ab: NONREACTIVE

## 2022-06-10 LAB — HIV ANTIBODY (ROUTINE TESTING W REFLEX): HIV 1&2 Ab, 4th Generation: NONREACTIVE

## 2022-06-30 ENCOUNTER — Other Ambulatory Visit: Payer: Self-pay

## 2022-07-01 ENCOUNTER — Other Ambulatory Visit: Payer: Self-pay | Admitting: Family Medicine

## 2022-07-01 ENCOUNTER — Telehealth: Payer: Self-pay | Admitting: Family Medicine

## 2022-07-01 NOTE — Telephone Encounter (Signed)
Called pt to inquire about imitrex request. During our wellness exam we did not discuss imitrex usage. Left voicemail for patient to call back to provide more information regarding need for imitrex.   If pt is going through a migraine right now I can give her a few pills but if it is just a regular refill she will need to come in for a visit to discuss medication management and see how therapy is working for her. This can be a mychart visit if needed.

## 2022-07-02 ENCOUNTER — Other Ambulatory Visit: Payer: Self-pay | Admitting: Neurology

## 2022-07-02 MED ORDER — SUMATRIPTAN SUCCINATE 100 MG PO TABS: 50.0000 mg | ORAL_TABLET | Freq: Once | ORAL | 5 refills | Status: AC | PRN

## 2023-03-19 LAB — HM MAMMOGRAPHY

## 2023-03-22 ENCOUNTER — Other Ambulatory Visit: Payer: Self-pay | Admitting: Family Medicine

## 2023-04-19 ENCOUNTER — Other Ambulatory Visit: Payer: Self-pay | Admitting: Family Medicine

## 2023-04-29 NOTE — Progress Notes (Signed)
Established patient visit   Patient: Tracy Burnett   DOB: 05-Apr-1971   52 y.o. Female  MRN: [id number] Visit Date: 04/30/2023  Today's healthcare provider: Charlton Amor, DO   Chief Complaint  Patient presents with   Annual Exam    SUBJECTIVE    Chief Complaint  Patient presents with   Annual Exam   HPI  Pt presents today for wellness exam.   Diet: varied more unhealthy foods at the moment Exercise: limited due to work  Screenings: Colon Cancer screenings (start at age 72): up to date per her report Pap smears: up to date Mammograms: one year ago  Family hx: Cancer: Skin  Social hx: Alcohol use: Socially will have 1-2 drinks  MDD -     Review of Systems  Constitutional:  Negative for activity change, fatigue and fever.  Respiratory:  Negative for cough and shortness of breath.   Cardiovascular:  Negative for chest pain.  Gastrointestinal:  Negative for abdominal pain.  Genitourinary:  Negative for difficulty urinating.       Current Meds  Medication Sig   CALCIUM PO Take by mouth.   FLUoxetine (PROZAC) 20 MG tablet Take 1 tablet (20 mg total) by mouth daily.   IRON PO Take 65 mg by mouth.   SUMAtriptan (IMITREX) 100 MG tablet Take 0.5-1 tablets (50-100 mg total) by mouth once as needed for up to 1 dose for migraine. May repeat in 2 hours if headache persists or recurs.   [DISCONTINUED] FLUoxetine (PROZAC) 10 MG tablet Take 1 tablet (10 mg total) by mouth daily. NO REFILLS. NEEDS TO FOLLOW UP WITH PCP.    OBJECTIVE    BP (!) 94/51 (BP Location: Right Arm, Patient Position: Sitting, Cuff Size: Normal)   Pulse 69   Resp 18   Ht 5\' 2"  (1.575 m)   Wt 168 lb 8 oz (76.4 kg)   SpO2 98%   BMI 30.82 kg/m   Physical Exam Vitals and nursing note reviewed.  Constitutional:      General: She is not in acute distress.    Appearance: Normal appearance.  HENT:     Head: Normocephalic and atraumatic.     Right Ear: External ear normal.     Left Ear:  External ear normal.     Nose: Nose normal.  Eyes:     Conjunctiva/sclera: Conjunctivae normal.  Cardiovascular:     Rate and Rhythm: Normal rate and regular rhythm.  Pulmonary:     Effort: Pulmonary effort is normal.     Breath sounds: Normal breath sounds.  Abdominal:     General: Abdomen is flat. Bowel sounds are normal.     Palpations: Abdomen is soft.     Tenderness: There is no abdominal tenderness.  Musculoskeletal:     Comments: 5/5 muscle strength upper and lower extremities bilaterally  Skin:    General: Skin is warm.  Neurological:     General: No focal deficit present.     Mental Status: She is alert and oriented to person, place, and time.  Psychiatric:        Mood and Affect: Mood normal.        Behavior: Behavior normal.        Thought Content: Thought content normal.        Judgment: Judgment normal.        ASSESSMENT & PLAN    Problem List Items Addressed This Visit       Other  Moderate episode of recurrent major depressive disorder (HCC)    52 year old female presents for wellness exam.  She did screen high on her PHQ-9 and GAD-7.  We did have a discussion today she has a teenager that is a cyst female that is wanting to become a transgender female and is currently wanting to undergo therapy.  She is dealing with a lot.  She would like to increase her Prozac at this time.  She denies therapy.      Relevant Medications   FLUoxetine (PROZAC) 20 MG tablet   Routine adult health maintenance - Primary    Patient up-to-date on screenings and we will go ahead and abstract this information Will obtain appropriate labs      Relevant Orders   CBC   BASIC METABOLIC PANEL WITH GFR   Lipid panel    Return in about 4 weeks (around 05/28/2023) for prozac follow up.      Meds ordered this encounter  Medications   FLUoxetine (PROZAC) 20 MG tablet    Sig: Take 1 tablet (20 mg total) by mouth daily.    Dispense:  90 tablet    Refill:  1    Orders Placed  This Encounter  Procedures   CBC   BASIC METABOLIC PANEL WITH GFR   Lipid panel    Order Specific Question:   Has the patient fasted?    Answer:   No    Order Specific Question:   Release to patient    Answer:   Immediate     Charlton Amor, DO  [facility] Medicine at [facility] [phone] (phone) [phone] (fax)  [facility]

## 2023-04-30 ENCOUNTER — Ambulatory Visit (INDEPENDENT_AMBULATORY_CARE_PROVIDER_SITE_OTHER): Payer: BLUE CROSS/BLUE SHIELD | Admitting: Family Medicine

## 2023-04-30 ENCOUNTER — Encounter: Payer: Self-pay | Admitting: Family Medicine

## 2023-04-30 VITALS — BP 94/51 | HR 69 | Resp 18 | Ht 62.0 in | Wt 168.5 lb

## 2023-04-30 DIAGNOSIS — R7303 Prediabetes: Secondary | ICD-10-CM | POA: Insufficient documentation

## 2023-04-30 DIAGNOSIS — F331 Major depressive disorder, recurrent, moderate: Secondary | ICD-10-CM | POA: Diagnosis not present

## 2023-04-30 DIAGNOSIS — Z1283 Encounter for screening for malignant neoplasm of skin: Secondary | ICD-10-CM

## 2023-04-30 DIAGNOSIS — Z0000 Encounter for general adult medical examination without abnormal findings: Secondary | ICD-10-CM

## 2023-04-30 LAB — CBC
HCT: 43.6 % (ref 35.0–45.0)
Hemoglobin: 14.4 g/dL (ref 11.7–15.5)
MCH: 28.7 pg (ref 27.0–33.0)
Platelets: 236 10*3/uL (ref 140–400)
RBC: 5.01 10*6/uL (ref 3.80–5.10)
WBC: 6.8 10*3/uL (ref 3.8–10.8)

## 2023-04-30 MED ORDER — FLUOXETINE HCL 20 MG PO TABS: 20.0000 mg | ORAL_TABLET | Freq: Every day | ORAL | 1 refills | Status: AC

## 2023-04-30 MED ORDER — FLUOXETINE HCL 20 MG PO TABS: 20.0000 mg | ORAL_TABLET | Freq: Every day | ORAL | 1 refills | Status: DC

## 2023-04-30 NOTE — Telephone Encounter (Signed)
Pt called. She is following up on pharmacy change to [facility].

## 2023-04-30 NOTE — Assessment & Plan Note (Signed)
Poc A1c in clinic today is 5.8%

## 2023-04-30 NOTE — Assessment & Plan Note (Signed)
Patient up-to-date on screenings and we will go ahead and abstract this information Will obtain appropriate labs

## 2023-04-30 NOTE — Addendum Note (Signed)
Addended by: Charlton Amor on: 04/30/2023 09:25 AM   Modules accepted: Orders

## 2023-04-30 NOTE — Assessment & Plan Note (Signed)
52 year old female presents for wellness exam.  She did screen high on her PHQ-9 and GAD-7.  We did have a discussion today she has a teenager that is a cyst female that is wanting to become a transgender female and is currently wanting to undergo therapy.  She is dealing with a lot.  She would like to increase her Prozac at this time.  She denies therapy.

## 2023-05-01 LAB — LIPID PANEL
Cholesterol: 255 mg/dL — ABNORMAL HIGH (ref <200)
HDL: 67 mg/dL (ref >=50)
LDL Cholesterol (Calc): 169 mg/dL (calc) — ABNORMAL HIGH
Non-HDL Cholesterol (Calc): 188 mg/dL (calc) — ABNORMAL HIGH (ref <130)
Total CHOL/HDL Ratio: 3.8 (calc) (ref <5.0)
Triglycerides: 85 mg/dL (ref <150)

## 2023-05-01 LAB — CBC
MCHC: 33 g/dL (ref 32.0–36.0)
MCV: 87 fL (ref 80.0–100.0)
MPV: 10.8 fL (ref 7.5–12.5)
RDW: 13 % (ref 11.0–15.0)

## 2023-05-01 LAB — BASIC METABOLIC PANEL WITH GFR
BUN: 17 mg/dL (ref 7–25)
CO2: 28 mmol/L (ref 20–32)
Calcium: 9.3 mg/dL (ref 8.6–10.4)
Chloride: 106 mmol/L (ref 98–110)
Creat: 0.91 mg/dL (ref 0.50–1.03)
Glucose, Bld: 96 mg/dL (ref 65–99)
Potassium: 4.7 mmol/L (ref 3.5–5.3)
Sodium: 142 mmol/L (ref 135–146)
eGFR: 76 mL/min/{1.73_m2} (ref >=60)

## 2023-05-04 ENCOUNTER — Encounter: Payer: Self-pay | Admitting: Family Medicine

## 2023-06-10 ENCOUNTER — Encounter: Payer: Self-pay | Admitting: Family Medicine

## 2023-06-10 ENCOUNTER — Ambulatory Visit (INDEPENDENT_AMBULATORY_CARE_PROVIDER_SITE_OTHER): Payer: BLUE CROSS/BLUE SHIELD | Admitting: Family Medicine

## 2023-06-10 VITALS — BP 115/64 | HR 81 | Resp 18 | Ht 62.0 in | Wt 167.5 lb

## 2023-06-10 DIAGNOSIS — Z6830 Body mass index (BMI) 30.0-30.9, adult: Secondary | ICD-10-CM | POA: Insufficient documentation

## 2023-06-10 DIAGNOSIS — F331 Major depressive disorder, recurrent, moderate: Secondary | ICD-10-CM

## 2023-06-10 DIAGNOSIS — E6609 Other obesity due to excess calories: Secondary | ICD-10-CM | POA: Diagnosis not present

## 2023-06-10 MED ORDER — ZEPBOUND 2.5 MG/0.5ML ~~LOC~~ SOAJ
2.5000 mg | SUBCUTANEOUS | 0 refills | Status: AC
Start: 2023-06-10 — End: ?

## 2023-06-10 NOTE — Assessment & Plan Note (Signed)
-   pt has had weight gain in the last few months - recommended continue diet and exercise  - will go ahead zepbound for patient

## 2023-06-10 NOTE — Assessment & Plan Note (Signed)
continue prozac

## 2023-06-10 NOTE — Progress Notes (Signed)
Established patient visit   Patient: Tracy Burnett   DOB: July 03, 1971   52 y.o. Female  MRN: [id number] Visit Date: 06/10/2023  Today's healthcare provider: Charlton Amor, DO   Chief Complaint  Patient presents with   Medical Management of Chronic Issues    Medication f/u Prozac    SUBJECTIVE    Chief Complaint  Patient presents with   Medical Management of Chronic Issues    Medication f/u Prozac   HPI HPI     Medical Management of Chronic Issues    Additional comments: Medication f/u Prozac      Last edited by Roselyn Reef, CMA on 06/10/2023 10:09 AM.      Pt presents for follow up on MDD and GAD. Currently on prozac 20mg  daily. Says it is working well for her.   Has some concerns of weight gain. Has noticed about a 30lb weight gain. Has done diet and exercise modifications.  Review of Systems  Constitutional:  Negative for activity change, fatigue and fever.  Respiratory:  Negative for cough and shortness of breath.   Cardiovascular:  Negative for chest pain.  Gastrointestinal:  Negative for abdominal pain.  Genitourinary:  Negative for difficulty urinating.     The 10-year ASCVD risk score (Arnett DK, et al., 2019) is: 1.4%   Values used to calculate the score:     Age: 74 years     Sex: Female     Is Non-Hispanic African American: No     Diabetic: No     Tobacco smoker: No     Systolic Blood Pressure: 115 mmHg     Is BP treated: No     HDL Cholesterol: 67 mg/dL     Total Cholesterol: 255 mg/dL     Current Meds  Medication Sig   CALCIUM PO Take by mouth.   ferrous sulfate 325 (65 FE) MG tablet Take by mouth.   FLUoxetine (PROZAC) 20 MG tablet Take 1 tablet (20 mg total) by mouth daily.   FLUoxetine (PROZAC) 20 MG tablet Take 1 tablet (20 mg total) by mouth daily.   IRON PO Take 65 mg by mouth.   Multiple Vitamin tablet Take by mouth.   SUMAtriptan (IMITREX) 100 MG tablet Take 0.5-1 tablets (50-100 mg total) by mouth once as needed for up to  1 dose for migraine. May repeat in 2 hours if headache persists or recurs.   tirzepatide (ZEPBOUND) 2.5 MG/0.5ML Pen Inject 2.5 mg into the skin once a week.    OBJECTIVE    BP 115/64 (BP Location: Right Arm, Patient Position: Sitting, Cuff Size: Normal)   Pulse 81   Resp 18   Ht 5\' 2"  (1.575 m)   Wt 167 lb 8 oz (76 kg)   SpO2 100%   BMI 30.64 kg/m   Physical Exam Vitals and nursing note reviewed.  Constitutional:      General: She is not in acute distress.    Appearance: Normal appearance.  HENT:     Head: Normocephalic and atraumatic.     Right Ear: External ear normal.     Left Ear: External ear normal.     Nose: Nose normal.  Eyes:     Conjunctiva/sclera: Conjunctivae normal.  Cardiovascular:     Rate and Rhythm: Normal rate and regular rhythm.  Pulmonary:     Effort: Pulmonary effort is normal.     Breath sounds: Normal breath sounds.  Neurological:     General: No focal deficit  present.     Mental Status: She is alert and oriented to person, place, and time.  Psychiatric:        Mood and Affect: Mood normal.        Behavior: Behavior normal.        Thought Content: Thought content normal.        Judgment: Judgment normal.        ASSESSMENT & PLAN    Problem List Items Addressed This Visit       Other   Moderate episode of recurrent major depressive disorder (HCC)    - continue prozac       Class 1 obesity due to excess calories with serious comorbidity and body mass index (BMI) of 30.0 to 30.9 in adult - Primary    - pt has had weight gain in the last few months - recommended continue diet and exercise  - will go ahead zepbound for patient       Relevant Medications   tirzepatide (ZEPBOUND) 2.5 MG/0.5ML Pen    No follow-ups on file.      Meds ordered this encounter  Medications   tirzepatide (ZEPBOUND) 2.5 MG/0.5ML Pen    Sig: Inject 2.5 mg into the skin once a week.    Dispense:  2 mL    Refill:  0    No orders of the defined types  were placed in this encounter.    Charlton Amor, DO  [facility] at [facility] [phone] (phone) [phone] (fax)  [facility]

## 2023-06-25 ENCOUNTER — Telehealth: Payer: Self-pay

## 2023-06-25 NOTE — Telephone Encounter (Addendum)
Initiated Prior authorization [id number] 2.5MG /0.5ML pen-injectors Via: Covermymeds Case/Key:[id number] Status:Denied as of 06/25/23 Reason:plan exclusion Notified Pt via: Mychart

## 2023-08-06 ENCOUNTER — Encounter: Payer: Self-pay | Admitting: Family Medicine

## 2023-10-26 ENCOUNTER — Other Ambulatory Visit: Payer: Self-pay | Admitting: Family Medicine

## 2023-12-07 ENCOUNTER — Other Ambulatory Visit: Payer: Self-pay | Admitting: Family Medicine

## 2023-12-07 DIAGNOSIS — E6609 Other obesity due to excess calories: Secondary | ICD-10-CM

## 2023-12-27 NOTE — Telephone Encounter (Signed)
 PA has been initiated for Zepbound on 12/27/23

## 2024-02-24 ENCOUNTER — Encounter: Payer: Self-pay | Admitting: Family Medicine

## 2024-05-02 ENCOUNTER — Ambulatory Visit: Payer: BLUE CROSS/BLUE SHIELD | Admitting: Dermatology

## 2024-05-02 DIAGNOSIS — D239 Other benign neoplasm of skin, unspecified: Secondary | ICD-10-CM

## 2024-05-02 DIAGNOSIS — D2371 Other benign neoplasm of skin of right lower limb, including hip: Secondary | ICD-10-CM

## 2024-05-02 DIAGNOSIS — D1801 Hemangioma of skin and subcutaneous tissue: Secondary | ICD-10-CM

## 2024-05-02 DIAGNOSIS — Z1283 Encounter for screening for malignant neoplasm of skin: Secondary | ICD-10-CM

## 2024-05-02 DIAGNOSIS — W908XXA Exposure to other nonionizing radiation, initial encounter: Secondary | ICD-10-CM

## 2024-05-02 DIAGNOSIS — Z808 Family history of malignant neoplasm of other organs or systems: Secondary | ICD-10-CM

## 2024-05-02 DIAGNOSIS — I781 Nevus, non-neoplastic: Secondary | ICD-10-CM

## 2024-05-02 DIAGNOSIS — L821 Other seborrheic keratosis: Secondary | ICD-10-CM

## 2024-05-02 DIAGNOSIS — D2271 Melanocytic nevi of right lower limb, including hip: Secondary | ICD-10-CM

## 2024-05-02 DIAGNOSIS — D229 Melanocytic nevi, unspecified: Secondary | ICD-10-CM

## 2024-05-02 DIAGNOSIS — L578 Other skin changes due to chronic exposure to nonionizing radiation: Secondary | ICD-10-CM

## 2024-05-02 DIAGNOSIS — D225 Melanocytic nevi of trunk: Secondary | ICD-10-CM

## 2024-05-02 DIAGNOSIS — K130 Diseases of lips: Secondary | ICD-10-CM

## 2024-05-02 DIAGNOSIS — L738 Other specified follicular disorders: Secondary | ICD-10-CM

## 2024-05-02 DIAGNOSIS — D2261 Melanocytic nevi of right upper limb, including shoulder: Secondary | ICD-10-CM

## 2024-05-02 DIAGNOSIS — L814 Other melanin hyperpigmentation: Secondary | ICD-10-CM | POA: Diagnosis not present

## 2024-05-02 MED ORDER — ALCLOMETASONE DIPROPIONATE 0.05 % EX OINT: TOPICAL_OINTMENT | CUTANEOUS | 1 refills | Status: AC

## 2024-05-02 NOTE — Patient Instructions (Addendum)

## 2024-05-02 NOTE — Progress Notes (Signed)
 New Patient Visit   Subjective  Tracy Burnett is a 53 y.o. female who presents for the following: Skin Cancer Screening and Full Body Skin Exam  The patient presents for Total-Body Skin Exam (TBSE) for skin cancer screening and mole check. The patient has spots, moles and lesions to be evaluated, some may be new or changing. No history of skin cancer.  The patient gets redness on her upper lip when stressed, possibly from licking lips, not itchy. Father with history of skin cancer.    The following portions of the chart were reviewed this encounter and updated as appropriate: medications, allergies, medical history  Review of Systems:  No other skin or systemic complaints except as noted in HPI or Assessment and Plan.  Objective  Well appearing patient in no apparent distress; mood and affect are within normal limits.  A full examination was performed including scalp, head, eyes, ears, nose, lips, neck, chest, axillae, abdomen, back, buttocks, bilateral upper extremities, bilateral lower extremities, hands, feet, fingers, toes, fingernails, and toenails. All findings within normal limits unless otherwise noted below.   Relevant physical exam findings are noted in the Assessment and Plan.    Assessment & Plan   SKIN CANCER SCREENING PERFORMED TODAY.  ACTINIC DAMAGE - Chronic condition, secondary to cumulative UV/sun exposure - diffuse scaly erythematous macules with underlying dyspigmentation - Recommend daily broad spectrum sunscreen SPF 30+ to sun-exposed areas, reapply every 2 hours as needed.  - Staying in the shade or wearing long sleeves, sun glasses (UVA+UVB protection) and wide brim hats (4-inch brim around the entire circumference of the hat) are also recommended for sun protection.  - Call for new or changing lesions.  LENTIGINES, SEBORRHEIC KERATOSES, HEMANGIOMAS - Benign normal skin lesions - Benign-appearing - Call for any changes  MELANOCYTIC NEVI - Tan-brown  and/or pink-flesh-colored symmetric macules and papules - 1.5 mm med dark brown macule at spinal mid back - 2 mm med dark brown macule at right upper elbow - 5 x 4 mm brown macule, darker center at left inf buttock - 5 x 3 mm med dark brown macule, lighter edge at right lower post hip - Benign appearing on exam today - Observation - Call clinic for new or changing moles - Recommend daily use of broad spectrum spf 30+ sunscreen to sun-exposed areas.   CHEILITIS Exam Scaly erythematous patches at upper lip  Treatment Plan Start alclomethasone ointment to aa upper lip BID as needed for flares.  Samples of Aquaphor, Vaseline, Dr Horald.  Topical steroids (such as triamcinolone , fluocinolone, fluocinonide, mometasone, clobetasol, halobetasol, betamethasone, hydrocortisone) can cause thinning and lightening of the skin if they are used for too long in the same area. Your physician has selected the right strength medicine for your problem and area affected on the body. Please use your medication only as directed by your physician to prevent side effects.    TELANGIECTASIA Exam: dilated blood vessel at nasal ala  Treatment Plan: Benign appearing on exam Call for changes  FAMILY HISTORY OF SKIN CANCER What type(s): unknown Who affected: father  Sebaceous Hyperplasia - Small yellow papules with a central dell, face - Benign-appearing - Observe. Call for changes.  DERMATOFIBROMA Exam: Firm pink/brown papulenodule with dimple sign at right medial thigh. Treatment Plan: A dermatofibroma is a benign growth possibly related to trauma, such as an insect bite, cut from shaving, or inflamed acne-type bump.  Treatment options to remove include shave or excision with resulting scar and risk of recurrence.  Since benign-appearing and not bothersome, will observe for now.     Return 2-3 years, for TBSE.  IAndrea Kerns, CMA, am acting as scribe for Rexene Rattler, MD .   Documentation: I  have reviewed the above documentation for accuracy and completeness, and I agree with the above.  Rexene Rattler, MD

## 2024-05-06 LAB — HM MAMMOGRAPHY

## 2024-05-10 ENCOUNTER — Encounter: Payer: Self-pay | Admitting: Family Medicine

## 2024-11-13 ENCOUNTER — Other Ambulatory Visit: Payer: Self-pay

## 2024-11-17 ENCOUNTER — Telehealth: Payer: Self-pay

## 2024-11-19 NOTE — Telephone Encounter (Signed)
 Will need OV before refilling  any medicines as her last OV with any provider was 06/10/23.

## 2024-11-20 NOTE — Telephone Encounter (Signed)
 Noted. Attempted to contact the patient. No answer. Left a brief vm msg the refill for Fluoxetine  from [facility] is denied. The patient was notified to keep her upcoming appointment scheduled on 11/30/24. Direct call back information provided.

## 2024-11-30 ENCOUNTER — Ambulatory Visit: Admitting: Family Medicine

## 2026-04-16 ENCOUNTER — Ambulatory Visit: Admitting: Dermatology
